# Patient Record
Sex: Female | Born: 1990
Health system: Southern US, Community
[De-identification: ages and names within clinical notes are randomized; demographics above are authoritative.]

## PROBLEM LIST (undated history)

## (undated) DIAGNOSIS — IMO0002 Reserved for concepts with insufficient information to code with codable children: Secondary | ICD-10-CM

## (undated) DIAGNOSIS — I10 Essential (primary) hypertension: Secondary | ICD-10-CM

## (undated) DIAGNOSIS — R569 Unspecified convulsions: Secondary | ICD-10-CM

## (undated) DIAGNOSIS — D689 Coagulation defect, unspecified: Secondary | ICD-10-CM

## (undated) DIAGNOSIS — M329 Systemic lupus erythematosus, unspecified: Secondary | ICD-10-CM

## (undated) DIAGNOSIS — M810 Age-related osteoporosis without current pathological fracture: Secondary | ICD-10-CM

## (undated) HISTORY — PX: NECK SURGERY: SHX720

## (undated) HISTORY — PX: TOOTH EXTRACTION: SUR596

## (undated) HISTORY — PX: PEG TUBE PLACEMENT: SUR1034

## (undated) HISTORY — PX: OTHER SURGICAL HISTORY: SHX169

---

## 2017-09-21 ENCOUNTER — Emergency Department (HOSPITAL_COMMUNITY): Payer: Medicare Other

## 2017-09-21 ENCOUNTER — Encounter (HOSPITAL_COMMUNITY): Payer: Self-pay | Admitting: Emergency Medicine

## 2017-09-21 ENCOUNTER — Emergency Department (HOSPITAL_COMMUNITY)
Admission: EM | Admit: 2017-09-21 | Discharge: 2017-09-21 | Disposition: A | Discharge status: Home / Self Care | Payer: Medicare Other | Attending: Emergency Medicine | Admitting: Emergency Medicine

## 2017-09-21 DIAGNOSIS — I10 Essential (primary) hypertension: Secondary | ICD-10-CM | POA: Insufficient documentation

## 2017-09-21 DIAGNOSIS — R079 Chest pain, unspecified: Secondary | ICD-10-CM | POA: Diagnosis present

## 2017-09-21 DIAGNOSIS — N3000 Acute cystitis without hematuria: Secondary | ICD-10-CM

## 2017-09-21 DIAGNOSIS — Z79899 Other long term (current) drug therapy: Secondary | ICD-10-CM | POA: Insufficient documentation

## 2017-09-21 DIAGNOSIS — M329 Systemic lupus erythematosus, unspecified: Secondary | ICD-10-CM | POA: Insufficient documentation

## 2017-09-21 DIAGNOSIS — M791 Myalgia, unspecified site: Secondary | ICD-10-CM | POA: Diagnosis not present

## 2017-09-21 HISTORY — DX: Reserved for concepts with insufficient information to code with codable children: IMO0002

## 2017-09-21 HISTORY — DX: Unspecified convulsions: R56.9

## 2017-09-21 HISTORY — DX: Essential (primary) hypertension: I10

## 2017-09-21 HISTORY — DX: Systemic lupus erythematosus, unspecified: M32.9

## 2017-09-21 HISTORY — DX: Coagulation defect, unspecified: D68.9

## 2017-09-21 HISTORY — DX: Age-related osteoporosis without current pathological fracture: M81.0

## 2017-09-21 LAB — COMPREHENSIVE METABOLIC PANEL
ALBUMIN: 3.1 g/dL — AB (ref 3.5–5.0)
ALT: 8 U/L — ABNORMAL LOW (ref 14–54)
AST: 15 U/L (ref 15–41)
Alkaline Phosphatase: 78 U/L (ref 38–126)
Anion gap: 7 (ref 5–15)
BILIRUBIN TOTAL: 0.5 mg/dL (ref 0.3–1.2)
BUN: 20 mg/dL (ref 6–20)
CO2: 21 mmol/L — ABNORMAL LOW (ref 22–32)
Calcium: 8.7 mg/dL — ABNORMAL LOW (ref 8.9–10.3)
Chloride: 111 mmol/L (ref 101–111)
Creatinine, Ser: 0.54 mg/dL (ref 0.44–1.00)
GFR calc Af Amer: >60 mL/min (ref >60)
GFR calc non Af Amer: >60 mL/min (ref >60)
GLUCOSE: 85 mg/dL (ref 65–99)
POTASSIUM: 3.2 mmol/L — AB (ref 3.5–5.1)
Sodium: 139 mmol/L (ref 135–145)
TOTAL PROTEIN: 10.7 g/dL — AB (ref 6.5–8.1)

## 2017-09-21 LAB — I-STAT BETA HCG BLOOD, ED (MC, WL, AP ONLY): HCG, QUANTITATIVE: 14.6 m[IU]/mL — AB (ref <5)

## 2017-09-21 LAB — URINALYSIS, ROUTINE W REFLEX MICROSCOPIC
BACTERIA UA: NONE SEEN
BILIRUBIN URINE: NEGATIVE
Glucose, UA: NEGATIVE mg/dL
Hgb urine dipstick: NEGATIVE
KETONES UR: NEGATIVE mg/dL
Nitrite: NEGATIVE
PROTEIN: 30 mg/dL — AB
Specific Gravity, Urine: 1.02 (ref 1.005–1.030)
pH: 6 (ref 5.0–8.0)

## 2017-09-21 LAB — CBC WITH DIFFERENTIAL/PLATELET
Abs Immature Granulocytes: 0 10*3/uL (ref 0.0–0.1)
BASOS ABS: 0 10*3/uL (ref 0.0–0.1)
Basophils Relative: 0 %
EOS PCT: 0 %
Eosinophils Absolute: 0 10*3/uL (ref 0.0–0.7)
HEMATOCRIT: 35 % — AB (ref 36.0–46.0)
HEMOGLOBIN: 10.7 g/dL — AB (ref 12.0–15.0)
Immature Granulocytes: 0 %
LYMPHS ABS: 2.9 10*3/uL (ref 0.7–4.0)
LYMPHS PCT: 35 %
MCH: 27.9 pg (ref 26.0–34.0)
MCHC: 30.6 g/dL (ref 30.0–36.0)
MCV: 91.1 fL (ref 78.0–100.0)
MONO ABS: 0.7 10*3/uL (ref 0.1–1.0)
MONOS PCT: 8 %
Neutro Abs: 4.6 10*3/uL (ref 1.7–7.7)
Neutrophils Relative %: 57 %
Platelets: 168 10*3/uL (ref 150–400)
RBC: 3.84 MIL/uL — ABNORMAL LOW (ref 3.87–5.11)
RDW: 13.8 % (ref 11.5–15.5)
WBC: 8.2 10*3/uL (ref 4.0–10.5)

## 2017-09-21 LAB — I-STAT TROPONIN, ED: Troponin i, poc: 0 ng/mL (ref 0.00–0.08)

## 2017-09-21 LAB — LIPASE, BLOOD: Lipase: 29 U/L (ref 11–51)

## 2017-09-21 LAB — PREGNANCY, URINE: Preg Test, Ur: NEGATIVE

## 2017-09-21 IMAGING — CR DG CHEST 2V
2 series · 2 of 2 positions shown · non-contrast
Comparison: None.

CLINICAL DATA: Lupus.  Chest pain.

EXAM:
CHEST - 2 VIEW

[chest pa]
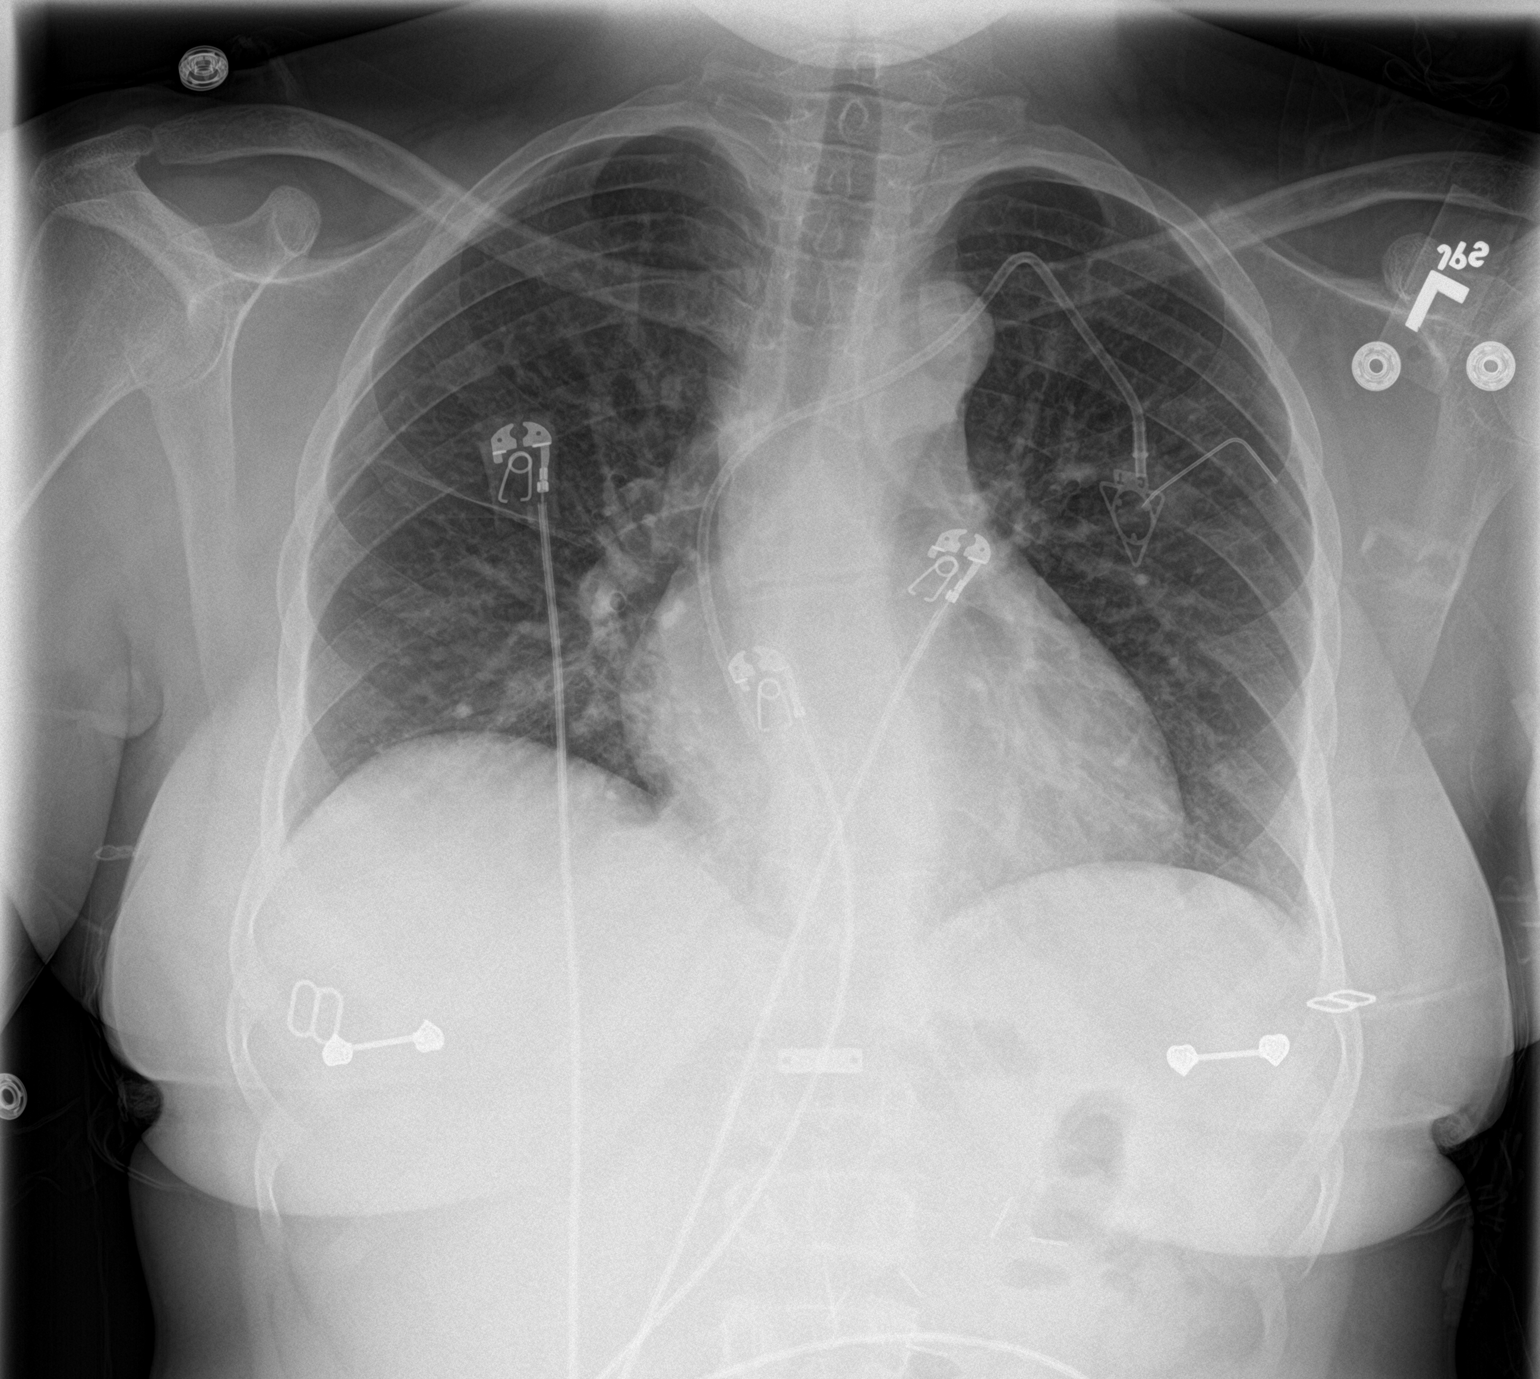

[chest lat]
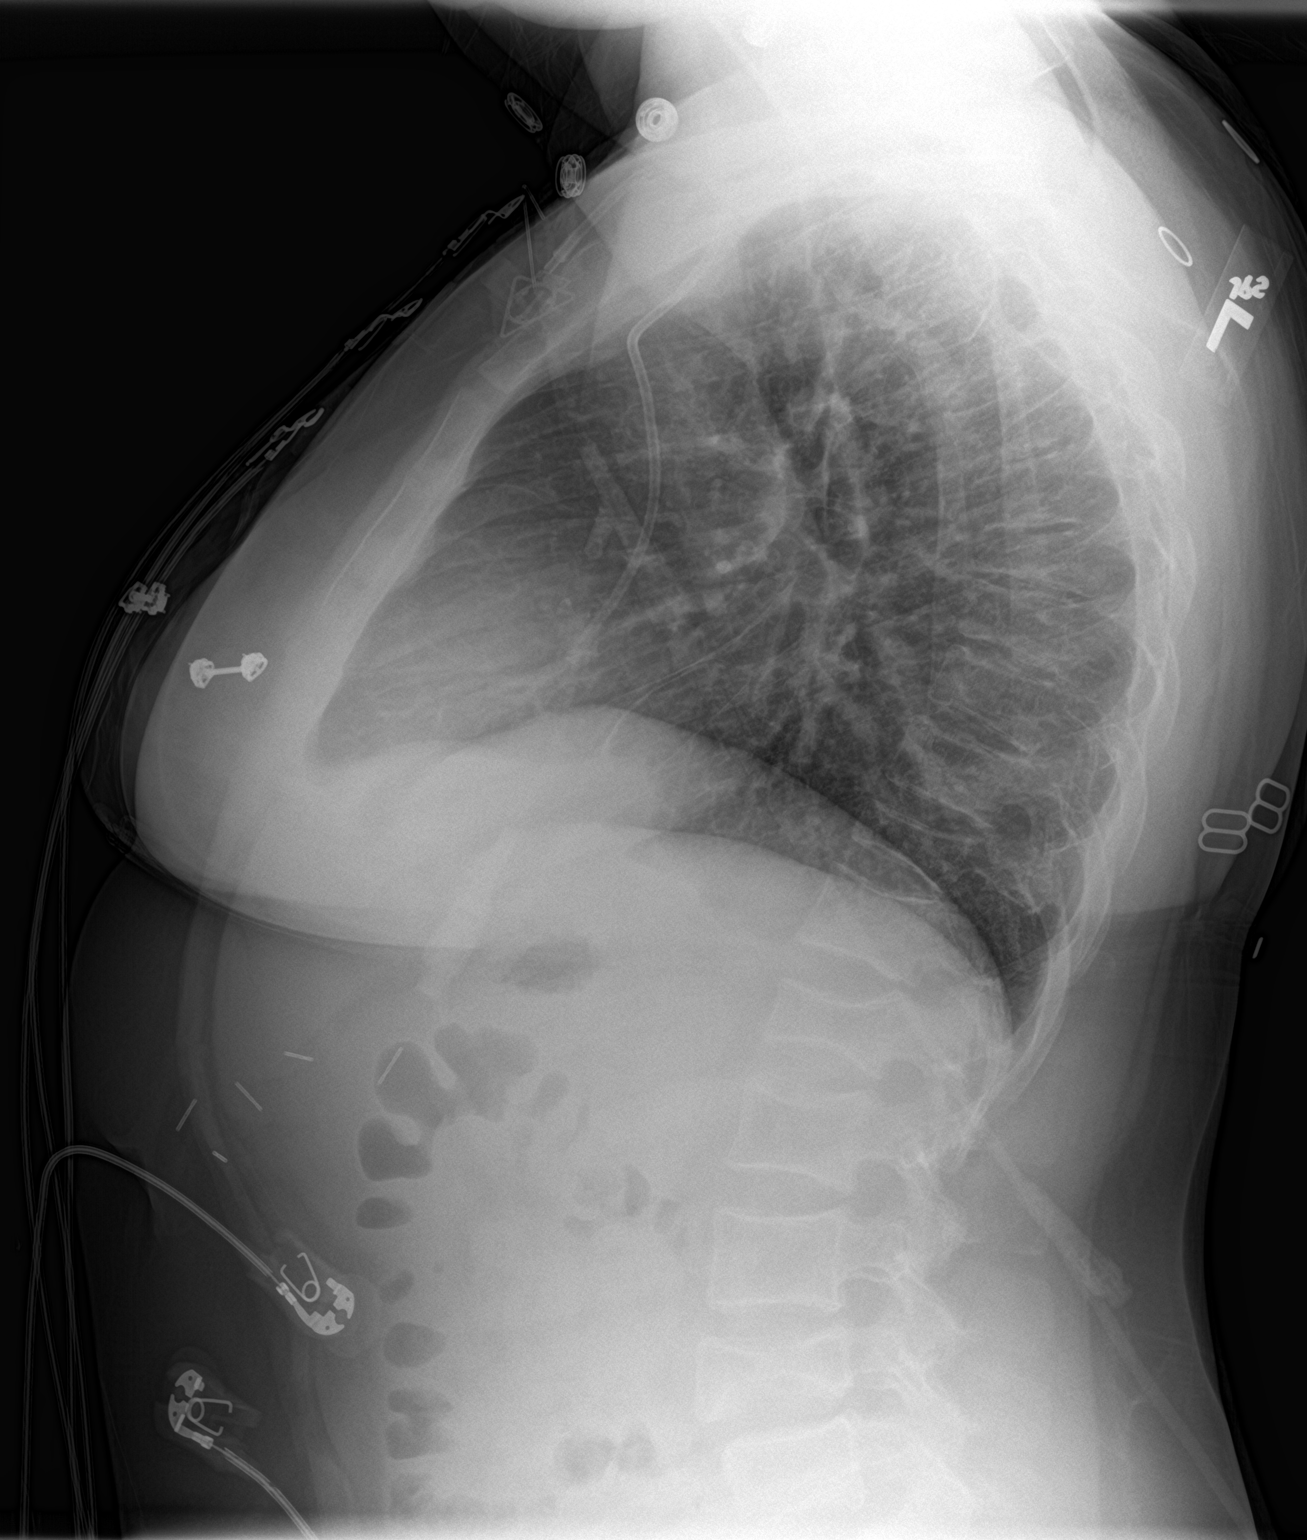

[2 of 2 positions shown; findings below may reference images not displayed]

FINDINGS: The heart size is exaggerated by low lung volumes. A left IJ
Port-A-Cath is accessed. Central compression fractures are present
throughout the thoracic and lumbar spine. No definite retropulsed
bone is present. These fractures appear remote [REDACTED] be related to
chronic steroid use.
IMPRESSION: 1. Low lung volumes.
2. No acute cardiopulmonary disease.
3. Left IJ Port-A-Cath.
4. Central compression fractures across multiple levels in the
thoracic and lumbar spine. No definite acute fracture. Please
correlate with localized pain or previous imaging.

## 2017-09-21 MED ORDER — METHYLPREDNISOLONE SODIUM SUCC 125 MG IJ SOLR
125.0000 mg | Freq: Once | INTRAMUSCULAR | Status: AC
  Administered 2017-09-21: 125 mg via INTRAVENOUS
  Filled 2017-09-21: qty 2

## 2017-09-21 MED ORDER — SODIUM CHLORIDE 0.9 % IV BOLUS
1000.0000 mL | Freq: Once | INTRAVENOUS | Status: AC
  Administered 2017-09-21: 1000 mL via INTRAVENOUS

## 2017-09-21 MED ORDER — CLONIDINE HCL 0.1 MG PO TABS
0.3000 mg | ORAL_TABLET | Freq: Once | ORAL | Status: AC
  Administered 2017-09-21: 0.3 mg via ORAL
  Filled 2017-09-21: qty 1

## 2017-09-21 MED ORDER — HEPARIN SOD (PORK) LOCK FLUSH 100 UNIT/ML IV SOLN
500.0000 [IU] | Freq: Once | INTRAVENOUS | Status: AC
  Administered 2017-09-21: 500 [IU]
  Filled 2017-09-21: qty 5

## 2017-09-21 MED ORDER — ONDANSETRON HCL 4 MG/2ML IJ SOLN
4.0000 mg | Freq: Once | INTRAMUSCULAR | Status: AC
  Administered 2017-09-21: 4 mg via INTRAVENOUS
  Filled 2017-09-21: qty 2

## 2017-09-21 MED ORDER — HYDROMORPHONE HCL 2 MG/ML IJ SOLN
0.5000 mg | Freq: Once | INTRAMUSCULAR | Status: AC
  Administered 2017-09-21: 0.5 mg via INTRAVENOUS
  Filled 2017-09-21: qty 1

## 2017-09-21 MED ORDER — CEPHALEXIN 500 MG PO CAPS: 500.0000 mg | ORAL_CAPSULE | Freq: Four times a day (QID) | ORAL | 0 refills | Status: DC

## 2017-09-21 MED ORDER — PROMETHAZINE HCL 25 MG/ML IJ SOLN
25.0000 mg | Freq: Once | INTRAMUSCULAR | Status: AC
  Administered 2017-09-21: 25 mg via INTRAVENOUS
  Filled 2017-09-21: qty 1

## 2017-09-21 MED ORDER — HYDROMORPHONE HCL 2 MG/ML IJ SOLN
1.0000 mg | Freq: Once | INTRAMUSCULAR | Status: AC
  Administered 2017-09-21: 1 mg via INTRAVENOUS
  Filled 2017-09-21: qty 1

## 2017-09-21 MED ORDER — SODIUM CHLORIDE 0.9 % IV SOLN
1.0000 g | Freq: Once | INTRAVENOUS | Status: AC
  Administered 2017-09-21: 1 g via INTRAVENOUS
  Filled 2017-09-21: qty 10

## 2017-09-21 MED ORDER — PREDNISONE 20 MG PO TABS: 40.0000 mg | ORAL_TABLET | Freq: Every day | ORAL | 0 refills | Status: AC

## 2017-09-21 NOTE — ED Notes (Signed)
Pt has not taken BP meds due to N/V.

## 2017-09-21 NOTE — Discharge Instructions (Addendum)
Take Prednisone as directed.  Take antibiotics as directed. Please take all of your antibiotics until finished.  Follow-up with your rheumatology doctor in the next 2 to 4 days for further evaluation.  Return to emergency department for any fever, chest pain, difficulty breathing, vomiting despite medications or any other worsening concerning symptoms.`

## 2017-09-21 NOTE — ED Triage Notes (Addendum)
Patient complains of nausea, neck pain, and loss of appetite x10 days Patient states she believes she is having a lupus flare-up. Alert, oriented, and ambulatory and in no apparent distress at this time.

## 2017-09-21 NOTE — ED Provider Notes (Signed)
Medical screening examination/treatment/procedure(s) were conducted as a shared visit with non-physician practitioner(s) and myself.  I personally evaluated the patient during the encounter.  EKG Interpretation  Date/Time:  Monday September 21 2017 12:25:25 EDT Ventricular Rate:  84 PR Interval:    QRS Duration: 112 QT Interval:  413 QTC Calculation: 489 R Axis:   29 Text Interpretation:  Sinus rhythm Borderline short PR interval Borderline intraventricular conduction delay Borderline prolonged QT interval No old tracing to compare Confirmed by Mancel BaleWentz, Elliott [phone]([id number]) on 09/22/2017 11:04:13 PM  Patient has prior history of lupus.  She reports recently she is having generalized pain.  Is also developed some nausea and vomiting.  She feels symptoms are consistent with prior episodes of lupus flareup.  She is compliant with Plaquenil and CellCept.  Patient is seen after initial phase of rehydration and pain control.  She is alert and appropriate no acute distress.  Heart is regular no gross rub murmur gallop.  Lungs clear without wheeze rhonchi or rale.  Movements are coordinated purposeful symmetric.  I agree with plan and management.   Arby BarrettePfeiffer, Sollie Vultaggio, MD 09/22/17 [id number]

## 2017-09-21 NOTE — ED Provider Notes (Signed)
[facility] Provider Note   CSN: [id number] Arrival date & time: 09/21/17  0827     History   Chief Complaint No chief complaint on file.   HPI Denise Browning is a 27 y.o. female possible history of lupus, osteoporosis, seizures who presents for evaluation of chest pain, nausea/vomiting consistent with previous episodes of lupus flare.  Patient reports that she has a history of lupus.  She follows a [place] Harro[place] [facility]obert [place] [place]  Patient reports [place] states she is not reestablish care yet.  Her last rheumatology appointment was several weeks ago.  Patient reports that for the last week and a half, she has had generalized pain, including intermittent chest pain that she describes as a sharp pressure.  She does report pain is worse with deep inspiration and worse with exertion.  Does not have any associated diaphoresis.  Patient reports that she has also had nausea and vomiting.  Emesis is nonbloody, nonbilious.  Patient reports tolerating small fluids but has not been able to tolerate much p.o.  Patient reports she has diffuse neck and back pain.  Patient reports that today's symptoms are consistent with her previous episodes of lupus flareup.  Patient reports she is on Plaquenil and CellCept which she states she has been compliant with.  Patient reports that she has had difficulty taking her medications over the last few days secondary to nausea/vomiting.  Patient denies any vision changes, fevers, abdominal pain, difficulty breathing, numbness/weakness of her extremities. She denies any OCP use, recent immobilization, prior history of DVT/PE, recent surgery, leg swelling, or long travel. Denies fevers, weight loss, numbness/weakness of upper and lower extremities, bowel/bladder incontinence, saddle anesthesia, history of back surgery, history of IVDA.   The  history is provided by the patient.    Past Medical History:  Diagnosis Date  . Clotting disorder (HCC)   . Hypertension   . Lupus (HCC)   . Osteoporosis   . Seizures (HCC)     There are no active problems to display for this patient.    The histories are not reviewed yet. Please review them in the "History" navigator section and refresh this SmartLink.   OB History   None      Home Medications    Prior to Admission medications   Medication Sig Start Date End Date Taking? Authorizing Provider  cetirizine (ZYRTEC) 10 MG tablet Take 10 mg by mouth as needed for allergies.   Yes [provider]  cloNIDine (CATAPRES) 0.3 MG tablet Take 0.3 mg by mouth 2 (two) times daily.   Yes [provider]  etonogestrel-ethinyl estradiol (NUVARING) 0.12-0.015 MG/24HR vaginal ring Place 1 each vaginally every 28 (twenty-eight) days. Insert vaginally and leave in place for 3 consecutive weeks, then remove for 1 week.   Yes [provider]  fondaparinux (ARIXTRA) 7.5 MG/0.6ML SOLN injection Inject 7.5 mg into the skin daily.   Yes [provider]  hydrocortisone cream 1 % Apply 1 application topically as needed for itching.   Yes [provider]  hydroxychloroquine (PLAQUENIL) 200 MG tablet Take 200 mg by mouth 2 (two) times daily.   Yes [provider]  ibuprofen (ADVIL,MOTRIN) 600 MG tablet Take 600 mg by mouth every 6 (six) hours as needed for moderate pain.   Yes [provider]  levETIRAcetam (KEPPRA) 100 MG/ML solution Take 1,500 mg by mouth 2 (two) times daily.  Yes [provider]  Multiple Vitamin (MULTIVITAMIN WITH MINERALS) TABS tablet Take 1 tablet by mouth daily.   Yes [provider]  mycophenolate (CELLCEPT) 200 MG/ML suspension Take 500 mg by mouth 2 (two) times daily.    Yes [provider]  oxyCODONE-acetaminophen (PERCOCET) 10-325 MG tablet Take 1 tablet by mouth every 6 (six) hours as  needed for pain.   Yes [provider]  oxyCODONE-acetaminophen (PERCOCET/ROXICET) 5-325 MG tablet Take 1 tablet by mouth every 6 (six) hours as needed for severe pain.   Yes [provider]  Probiotic Product (PROBIOTIC-10 PO) Take 1 tablet by mouth daily.   Yes [provider]  promethazine (PHENERGAN) 25 MG tablet Take 25 mg by mouth every 12 (twelve) hours as needed for nausea or vomiting.   Yes [provider]  tiZANidine (ZANAFLEX) 4 MG tablet Take 4 mg by mouth every 8 (eight) hours as needed for muscle spasms.   Yes [provider]  topiramate (TOPAMAX) 25 MG tablet Take 25 mg by mouth 2 (two) times daily.   Yes [provider]  zolpidem (AMBIEN) 10 MG tablet Take 10 mg by mouth at bedtime as needed for sleep.   Yes [provider]    Family History No family history on file.  Social History Social History   Tobacco Use  . Smoking status: Not on file  Substance Use Topics  . Alcohol use: Not on file  . Drug use: Not on file     Allergies   Belimumab; Morphine and related; Shellfish allergy; Sulfa antibiotics; and Toradol [ketorolac tromethamine]   Review of Systems Review of Systems  Constitutional: Positive for appetite change. Negative for chills and fever.  Eyes: Negative for visual disturbance.  Respiratory: Negative for cough and shortness of breath.   Cardiovascular: Positive for chest pain.  Gastrointestinal: Positive for nausea and vomiting. Negative for abdominal pain and diarrhea.  Genitourinary: Negative for dysuria and hematuria.  Musculoskeletal: Positive for back pain, myalgias and neck pain.  Skin: Negative for rash.  Neurological: Negative for dizziness, weakness, numbness and headaches.  All other systems reviewed and are negative.    Physical Exam Updated Vital Signs BP (!) 134/107   Pulse 80   Temp 98.1 F (36.7 C) (Oral)   Resp 18   LMP 08/20/2017   SpO2 100%   Physical Exam    Constitutional: She is oriented to person, place, and time. She appears well-developed and well-nourished.  HENT:  Head: Normocephalic and atraumatic.  Mouth/Throat: Oropharynx is clear and moist and mucous membranes are normal.  Eyes: Pupils are equal, round, and reactive to light. Conjunctivae, EOM and lids are normal.  Neck: Full passive range of motion without pain.  Full flexion/extension and lateral movement of neck fully intact. No bony midline tenderness. No deformities or crepitus.   Cardiovascular: Normal rate, regular rhythm, normal heart sounds and normal pulses. Exam reveals no gallop and no friction rub.  No murmur heard. Port noted to left anterior chest wall.  No surrounding erythema, warmth, drainage.  Pulmonary/Chest: Effort normal and breath sounds normal.  Lungs clear to auscultation bilaterally.  Symmetric chest rise.  No wheezing, rales, rhonchi.  Abdominal: Soft. Normal appearance. There is no tenderness. There is no rigidity and no guarding.  Musculoskeletal: Normal range of motion.  Neurological: She is alert and oriented to person, place, and time.  Skin: Skin is warm and dry. Capillary refill takes less than 2 seconds.  Psychiatric: She has a  normal mood and affect. Her speech is normal.  Nursing note and vitals reviewed.    ED Treatments / Results  Labs (all labs ordered are listed, but only abnormal results are displayed) Labs Reviewed  COMPREHENSIVE METABOLIC PANEL - Abnormal; Notable for the following components:      Result Value   Potassium 3.2 (*)    CO2 21 (*)    Calcium 8.7 (*)    Total Protein 10.7 (*)    Albumin 3.1 (*)    ALT 8 (*)    All other components within normal limits  CBC WITH DIFFERENTIAL/PLATELET - Abnormal; Notable for the following components:   RBC 3.84 (*)    Hemoglobin 10.7 (*)    HCT 35.0 (*)    All other components within normal limits  URINALYSIS, ROUTINE W REFLEX MICROSCOPIC - Abnormal; Notable for the following  components:   Protein, ur 30 (*)    Leukocytes, UA MODERATE (*)    All other components within normal limits  I-STAT BETA HCG BLOOD, ED ([facility], [facility], [facility] ONLY) - Abnormal; Notable for the following components:   I-stat hCG, quantitative 14.6 (*)    All other components within normal limits  LIPASE, BLOOD  PREGNANCY, URINE  I-STAT TROPONIN, ED    EKG None  Radiology Dg Chest 2 View  Result Date: 09/21/2017 CLINICAL DATA:  Lupus.  Chest pain. EXAM: CHEST - 2 VIEW COMPARISON:  None. FINDINGS: The heart size is exaggerated by low lung volumes. A left IJ Port-A-Cath is accessed. Central compression fractures are present throughout the thoracic and lumbar spine. No definite retropulsed bone is present. These fractures appear remote in may be related to chronic steroid use. IMPRESSION: 1. Low lung volumes. 2. No acute cardiopulmonary disease. 3. Left IJ Port-A-Cath. 4. Central compression fractures across multiple levels in the thoracic and lumbar spine. No definite acute fracture. Please correlate with localized pain or previous imaging. Electronically Signed   By: Marin Roberts M.D.   On: 09/21/2017 12:03    Procedures Procedures (including critical care time)  Medications Ordered in ED Medications  cloNIDine (CATAPRES) tablet 0.3 mg (has no administration in time range)  sodium chloride 0.9 % bolus 1,000 mL (0 mLs Intravenous Stopped 09/21/17 1335)  ondansetron (ZOFRAN) injection 4 mg (4 mg Intravenous Given 09/21/17 1137)  HYDROmorphone (DILAUDID) injection 0.5 mg (0.5 mg Intravenous Given 09/21/17 1137)  methylPREDNISolone sodium succinate (SOLU-MEDROL) 125 mg/2 mL injection 125 mg (125 mg Intravenous Given 09/21/17 1137)  HYDROmorphone (DILAUDID) injection 1 mg (1 mg Intravenous Given 09/21/17 1335)  ondansetron (ZOFRAN) injection 4 mg (4 mg Intravenous Given 09/21/17 1335)  sodium chloride 0.9 % bolus 1,000 mL (0 mLs Intravenous Stopped 09/21/17 1532)  cefTRIAXone (ROCEPHIN) 1 g in sodium  chloride 0.9 % 100 mL IVPB (1 g Intravenous New Bag/Given 09/21/17 1533)  promethazine (PHENERGAN) injection 25 mg (25 mg Intravenous Given 09/21/17 1546)  HYDROmorphone (DILAUDID) injection 0.5 mg (0.5 mg Intravenous Given 09/21/17 1546)     Initial Impression / Assessment and Plan / ED Course  I have reviewed the triage vital signs and the nursing notes.  Pertinent labs & imaging results that were available during my care of the patient were reviewed by me and considered in my medical decision making (see chart for details).       27 year old female with possible history of lupus who presents for generalized myalgias, nausea/vomiting consistent with lupus flare.  Follows rheumatology in [place].  Is relocating to [facility] and has  not established with rheumatology yet.  She still sees her rheumatologist in [place].  No recent fevers. Patient is afebrile, non-toxic appearing, sitting comfortably on examination table. Vital signs reviewed.  Patient is slightly tachycardic.  Plan for basic labs, fluids, pain medications.  UA shows moderate leukocytes, pyuria with few squamous cells seen.  Troponin negative.  Urine pregnancy negative.  I-STAT beta slightly elevated.  Lipase unremarkable.  CBC shows no significant leukocytosis.  Hemoglobin is slightly low at 10.7.  CMP shows slight hypokalemia at 3.2.  Bicarb is 21.  Otherwise unremarkable.  Discussed results with patient.  She does not know if she has been diagnosed with anemia before.  There is no previous CBCs for comparison.  Chest x-ray negative for any acute infectious etiology.  There is mention of some compression fractures but do not appear to be acute.  I discussed results with patient.  She reports she has known about the compression fractures and states that she has had them from her osteoporosis.  Re-evaluation. Patient denies any blood in stool.  She reports feeling slightly better after fluids, Zofran and  analgesics.  But still having some slight pain.  Will give additional analgesics.  Reevaluation.  Patient reports she feels better.  Will give 1 more dose of pain medication and antiemetics for symptomatic relief.  I discussed admission options with patient.  Offered to admit her for further observation of lupus flare with continuous fluids, pain medications.  Patient does not wish to be admitted at this time.  Plan to send home with prednisone.  Patient instructed to follow-up with her rheumatologist.  Reevaluation after analgesics and antiemetics.  Patient reports feeling better.  Again offered patient admission but states that she feels better after analgesics and does not wish to be admitted at this time.  Plan to p.o. challenge patient in the department.  Patient able to tolerate crackers and water here in the ED without any difficulty.  Patient requesting her blood pressure medications and she was unable to tolerate it today.  Will give 1 dose here in the ED.  I discussed with patient regarding treatment options.  Again patient declines admission at this time.  Patient stable for discharge at this time.  She has been able to walk without any difficulty.  He has tolerated p.o. without any difficulty. Patient stable for discharge. Patient had ample opportunity for questions and discussion. All patient's questions were answered with full understanding. Strict return precautions discussed. Patient expresses understanding and agreement to plan.    Final Clinical Impressions(s) / ED Diagnoses   Final diagnoses:  Myalgia  Lupus (HCC)  Acute cystitis without hematuria    ED Discharge Orders    None       Rosana Hoes 09/22/17 1708    Arby Barrette, MD 09/22/17 2339

## 2017-09-21 NOTE — ED Notes (Signed)
Patient asks that blood be drawn from her powerport.

## 2017-10-19 ENCOUNTER — Emergency Department (HOSPITAL_BASED_OUTPATIENT_CLINIC_OR_DEPARTMENT_OTHER)
Admission: EM | Admit: 2017-10-19 | Discharge: 2017-10-19 | Disposition: A | Discharge status: Home / Self Care | Payer: Medicare Other | Attending: Emergency Medicine | Admitting: Emergency Medicine

## 2017-10-19 ENCOUNTER — Encounter (HOSPITAL_BASED_OUTPATIENT_CLINIC_OR_DEPARTMENT_OTHER): Payer: Self-pay | Admitting: Emergency Medicine

## 2017-10-19 ENCOUNTER — Other Ambulatory Visit: Payer: Self-pay

## 2017-10-19 DIAGNOSIS — M3210 Systemic lupus erythematosus, organ or system involvement unspecified: Secondary | ICD-10-CM | POA: Insufficient documentation

## 2017-10-19 DIAGNOSIS — Z79899 Other long term (current) drug therapy: Secondary | ICD-10-CM | POA: Insufficient documentation

## 2017-10-19 DIAGNOSIS — I10 Essential (primary) hypertension: Secondary | ICD-10-CM | POA: Insufficient documentation

## 2017-10-19 DIAGNOSIS — R112 Nausea with vomiting, unspecified: Secondary | ICD-10-CM | POA: Diagnosis not present

## 2017-10-19 DIAGNOSIS — M7918 Myalgia, other site: Secondary | ICD-10-CM | POA: Diagnosis present

## 2017-10-19 LAB — URINALYSIS, MICROSCOPIC (REFLEX)

## 2017-10-19 LAB — CBC WITH DIFFERENTIAL/PLATELET
BASOS ABS: 0 10*3/uL (ref 0.0–0.1)
Basophils Relative: 0 %
EOS ABS: 0.1 10*3/uL (ref 0.0–0.7)
Eosinophils Relative: 1 %
HCT: 33.6 % — ABNORMAL LOW (ref 36.0–46.0)
Hemoglobin: 10.6 g/dL — ABNORMAL LOW (ref 12.0–15.0)
LYMPHS ABS: 7 10*3/uL — AB (ref 0.7–4.0)
Lymphocytes Relative: 58 %
MCH: 28.8 pg (ref 26.0–34.0)
MCHC: 31.5 g/dL (ref 30.0–36.0)
MCV: 91.3 fL (ref 78.0–100.0)
MONO ABS: 1.2 10*3/uL — AB (ref 0.1–1.0)
Monocytes Relative: 10 %
NEUTROS PCT: 31 %
Neutro Abs: 3.8 10*3/uL (ref 1.7–7.7)
PLATELETS: 116 10*3/uL — AB (ref 150–400)
RBC: 3.68 MIL/uL — AB (ref 3.87–5.11)
RDW: 14.4 % (ref 11.5–15.5)
WBC: 12.1 10*3/uL — ABNORMAL HIGH (ref 4.0–10.5)

## 2017-10-19 LAB — COMPREHENSIVE METABOLIC PANEL
ALBUMIN: 3.2 g/dL — AB (ref 3.5–5.0)
ALT: 7 U/L (ref 0–44)
ANION GAP: 7 (ref 5–15)
AST: 17 U/L (ref 15–41)
Alkaline Phosphatase: 78 U/L (ref 38–126)
BUN: 10 mg/dL (ref 6–20)
CALCIUM: 8.1 mg/dL — AB (ref 8.9–10.3)
CO2: 19 mmol/L — ABNORMAL LOW (ref 22–32)
Chloride: 109 mmol/L (ref 98–111)
Creatinine, Ser: 0.49 mg/dL (ref 0.44–1.00)
GFR calc Af Amer: >60 mL/min (ref >60)
GFR calc non Af Amer: >60 mL/min (ref >60)
Glucose, Bld: 99 mg/dL (ref 70–99)
Potassium: 3.7 mmol/L (ref 3.5–5.1)
SODIUM: 135 mmol/L (ref 135–145)
TOTAL PROTEIN: 10 g/dL — AB (ref 6.5–8.1)
Total Bilirubin: 0.1 mg/dL — ABNORMAL LOW (ref 0.3–1.2)

## 2017-10-19 LAB — URINALYSIS, ROUTINE W REFLEX MICROSCOPIC
BILIRUBIN URINE: NEGATIVE
Glucose, UA: NEGATIVE mg/dL
Hgb urine dipstick: NEGATIVE
KETONES UR: NEGATIVE mg/dL
NITRITE: NEGATIVE
PH: 6.5 (ref 5.0–8.0)
Protein, ur: 30 mg/dL — AB
Specific Gravity, Urine: 1.01 (ref 1.005–1.030)

## 2017-10-19 LAB — LIPASE, BLOOD: Lipase: 25 U/L (ref 11–51)

## 2017-10-19 LAB — PREGNANCY, URINE: Preg Test, Ur: NEGATIVE

## 2017-10-19 MED ORDER — ONDANSETRON HCL 4 MG/2ML IJ SOLN
4.0000 mg | Freq: Once | INTRAMUSCULAR | Status: AC
  Administered 2017-10-19: 4 mg via INTRAVENOUS
  Filled 2017-10-19: qty 2

## 2017-10-19 MED ORDER — SODIUM CHLORIDE 0.9 % IV BOLUS
1000.0000 mL | Freq: Once | INTRAVENOUS | Status: AC
  Administered 2017-10-19: 1000 mL via INTRAVENOUS

## 2017-10-19 MED ORDER — HYDROMORPHONE HCL 1 MG/ML IJ SOLN
1.0000 mg | Freq: Once | INTRAMUSCULAR | Status: AC
  Administered 2017-10-19: 1 mg via INTRAVENOUS
  Filled 2017-10-19: qty 1

## 2017-10-19 MED ORDER — FENTANYL CITRATE (PF) 100 MCG/2ML IJ SOLN
50.0000 ug | Freq: Once | INTRAMUSCULAR | Status: AC
  Administered 2017-10-19: 50 ug via INTRAVENOUS
  Filled 2017-10-19 (×2): qty 2

## 2017-10-19 MED ORDER — ONDANSETRON 4 MG PO TBDP: ORAL_TABLET | ORAL | 0 refills | Status: DC

## 2017-10-19 MED ORDER — HEPARIN SOD (PORK) LOCK FLUSH 100 UNIT/ML IV SOLN
INTRAVENOUS | Status: AC
  Administered 2017-10-19: 500 [IU]
  Filled 2017-10-19: qty 5

## 2017-10-19 MED FILL — ONDANSETRON ODT 4 MG TABLET: 4 | 3 days supply | Qty: 10 | Fill #0

## 2017-10-19 NOTE — ED Provider Notes (Addendum)
[facility] EMERGENCY DEPARTMENT Provider Note   CSN: [id number] Arrival date & time: 10/19/17  4098     History   Chief Complaint Chief Complaint  Patient presents with  . Lupus flare    HPI [place] Kairi Harshbarger is a 27 y.o. female.  HPI  This is a 27 year old female with a history of lupus, hypertension, osteoporosis, seizures who presents with pain, nausea and vomiting.  Patient reports history of lupus for which she takes CellCept and Plaquenil.  She is establishing care at [facility] and has her first appointment July 15.  Since Thursday she reports generalized body aches mostly in the back, right shoulder, and legs as well as nausea and vomiting.  Currently she rates her pain at 8 out of 10.  It is mostly achy.  This is classic for her lupus flares.  She states that she tried to do a clear liquid diet but continued to have more vomiting.  She reports some epigastric pain which is worse with vomiting.  She denies any recent fevers, chest pain, shortness of breath.  Last menstrual period was June 18.  She does not believe she would be pregnant.  She denies any urinary symptoms.  Patient frequently requires fluids and pain and nausea medication.  She often times will be discharged with a prednisone taper.  Past Medical History:  Diagnosis Date  . Clotting disorder (HCC)   . Hypertension   . Lupus (HCC)   . Osteoporosis   . Seizures (HCC)     There are no active problems to display for this patient.   Past Surgical History:  Procedure Laterality Date  . NECK SURGERY    . PEG TUBE PLACEMENT    . power port placement    . TOOTH EXTRACTION       OB History   None      Home Medications    Prior to Admission medications   Medication Sig Start Date End Date Taking? Authorizing Provider  cephALEXin (KEFLEX) 500 MG capsule Take 1 capsule (500 mg total) by mouth 4 (four) times daily. 09/21/17   Maxwell Caul, PA-C  cetirizine (ZYRTEC) 10 MG tablet Take 10 mg  by mouth as needed for allergies.    [provider]  cloNIDine (CATAPRES) 0.3 MG tablet Take 0.3 mg by mouth 2 (two) times daily.    [provider]  etonogestrel-ethinyl estradiol (NUVARING) 0.12-0.015 MG/24HR vaginal ring Place 1 each vaginally every 28 (twenty-eight) days. Insert vaginally and leave in place for 3 consecutive weeks, then remove for 1 week.    [provider]  fondaparinux (ARIXTRA) 7.5 MG/0.6ML SOLN injection Inject 7.5 mg into the skin daily.    [provider]  hydrocortisone cream 1 % Apply 1 application topically as needed for itching.    [provider]  hydroxychloroquine (PLAQUENIL) 200 MG tablet Take 200 mg by mouth 2 (two) times daily.    [provider]  ibuprofen (ADVIL,MOTRIN) 600 MG tablet Take 600 mg by mouth every 6 (six) hours as needed for moderate pain.    [provider]  levETIRAcetam (KEPPRA) 100 MG/ML solution Take 1,500 mg by mouth 2 (two) times daily.     [provider]  Multiple Vitamin (MULTIVITAMIN WITH MINERALS) TABS tablet Take 1 tablet by mouth daily.    [provider]  mycophenolate (CELLCEPT) 200 MG/ML suspension Take 500 mg by mouth 2 (two) times daily.     [provider]  oxyCODONE-acetaminophen (PERCOCET) [phone]  MG tablet Take 1 tablet by mouth every 6 (six) hours as needed for pain.    [provider]  oxyCODONE-acetaminophen (PERCOCET/ROXICET) 5-325 MG tablet Take 1 tablet by mouth every 6 (six) hours as needed for severe pain.    [provider]  Probiotic Product (PROBIOTIC-10 PO) Take 1 tablet by mouth daily.    [provider]  promethazine (PHENERGAN) 25 MG tablet Take 25 mg by mouth every 12 (twelve) hours as needed for nausea or vomiting.    [provider]  tiZANidine (ZANAFLEX) 4 MG tablet Take 4 mg by mouth every 8 (eight) hours as needed for muscle spasms.    [provider]  topiramate (TOPAMAX)  25 MG tablet Take 25 mg by mouth 2 (two) times daily.    [provider]  zolpidem (AMBIEN) 10 MG tablet Take 10 mg by mouth at bedtime as needed for sleep.    [provider]    Family History No family history on file.  Social History Social History   Tobacco Use  . Smoking status: Never Smoker  . Smokeless tobacco: Never Used  Substance Use Topics  . Alcohol use: Yes    Comment: occasional  . Drug use: Never     Allergies   Belimumab; Morphine and related; Shellfish allergy; Sulfa antibiotics; and Toradol [ketorolac tromethamine]   Review of Systems Review of Systems  Constitutional: Negative for fever.  Respiratory: Negative for shortness of breath.   Cardiovascular: Negative for chest pain.  Gastrointestinal: Positive for abdominal pain, nausea and vomiting. Negative for constipation and diarrhea.  Genitourinary: Negative for dysuria.  Musculoskeletal: Positive for back pain and myalgias.  Neurological: Positive for headaches. Negative for dizziness and weakness.  All other systems reviewed and are negative.    Physical Exam Updated Vital Signs BP (!) 149/111 (BP Location: Left Arm)   Pulse (!) 120   Temp 98.8 F (37.1 C) (Oral)   Resp 18   LMP 10/06/2017   SpO2 100%   Physical Exam  Constitutional: She is oriented to person, place, and time. She appears well-developed and well-nourished.  Chronically ill-appearing, nontoxic  HENT:  Head: Normocephalic and atraumatic.  Eyes: Pupils are equal, round, and reactive to light.  Neck: Neck supple.  Cardiovascular: Normal rate and normal heart sounds.  No murmur heard. Tachycardia  Pulmonary/Chest: Effort normal and breath sounds normal. No respiratory distress. She has no wheezes.  Port left upper chest  Abdominal: Soft. Bowel sounds are normal. There is no tenderness. There is no rebound and no guarding.  Musculoskeletal: She exhibits no edema.  Neurological: She is alert and oriented to  person, place, and time.  Skin: Skin is warm and dry.  Psychiatric: She has a normal mood and affect.  Nursing note and vitals reviewed.    ED Treatments / Results  Labs (all labs ordered are listed, but only abnormal results are displayed) Labs Reviewed  CBC WITH DIFFERENTIAL/PLATELET  COMPREHENSIVE METABOLIC PANEL  LIPASE, BLOOD  URINALYSIS, ROUTINE W REFLEX MICROSCOPIC  POC URINE PREG, ED    EKG EKG Interpretation  Date/Time:  Monday October 19 2017 07:02:04 EDT Ventricular Rate:  117 PR Interval:    QRS Duration: 93 QT Interval:  347 QTC Calculation: 485 R Axis:   52 Text Interpretation:  Sinus tachycardia Borderline repolarization abnormality Borderline prolonged QT interval Baseline wander in lead(s) V2 Confirmed by Ross MarcusHorton, Courtney ([id number]) on 10/19/2017 7:04:53 AM   Radiology No results found.  Procedures Procedures (including critical care  time)  Medications Ordered in ED Medications  ondansetron (ZOFRAN) injection 4 mg (has no administration in time range)  fentaNYL (SUBLIMAZE) injection 50 mcg (has no administration in time range)  sodium chloride 0.9 % bolus 1,000 mL (has no administration in time range)     Initial Impression / Assessment and Plan / ED Course  I have reviewed the triage vital signs and the nursing notes.  Pertinent labs & imaging results that were available during my care of the patient were reviewed by me and considered in my medical decision making (see chart for details).     Patient presents with symptoms consistent with prior lupus flares.  She is overall nontoxic-appearing.  Vital signs are notable for heart rate of 120.  Basic lab work obtained to evaluate for metabolic derangements.  Will obtain EKG as well.  Patient given fluids for her tachycardia and likely dehydration given history.  Additionally, she was given pain and nausea medication.  All work-up is pending and will be signed out to oncoming provider pending  reassessment.  Final Clinical Impressions(s) / ED Diagnoses   Final diagnoses:  None    ED Discharge Orders    None       Horton, Mayer Masker, MD 10/19/17 1610    Shon Baton, MD 10/19/17 [phone]

## 2017-10-19 NOTE — ED Provider Notes (Signed)
  Physical Exam  BP (!) 137/91   Pulse (!) 104   Temp 98.8 F (37.1 C) (Oral)   Resp 16   LMP 10/06/2017   SpO2 100%   Physical Exam  ED Course/Procedures     Procedures  MDM  Patient care assumed at 7 am. Patient has hx of lupus on cellcept and plaquenil and here with vomiting, body aches. She states that this is typical of her lupus flare. Sign out pending labs, pain control, PO trial.   9:56 AM Labs unremarkable. Given zofran, fentanyl, dilaudid and pain improved. Tolerated PO in the ED. HR down to 104 in the ED from 120. Will refill her zofran. Stable for discharge.    Charlynne PanderYao, Jaquaveon Bilal Hsienta, MD 10/19/17 [phone]

## 2017-10-19 NOTE — ED Notes (Signed)
EDP aware that no pain med given.  He stated that we will wait for the lab results and he will go speak with pt and update the plan of care.  No pain med given at this time.  Pt in NAD.  No nonverbal signs of pain.

## 2017-10-19 NOTE — ED Triage Notes (Signed)
Hx lupus, pt is concerned she may be having a flare. N/V pain in legs, abd and back. Also reports insomnia. NAD

## 2017-10-19 NOTE — Discharge Instructions (Signed)
Take zofran for nausea.   Continue your current meds for lupus.   [facility]   Return to ER if you have worse abdominal pain, vomiting, fever, dehydration

## 2017-10-19 NOTE — ED Notes (Signed)
ED Provider at bedside, Dr. Silverio LayYao.

## 2017-10-20 LAB — PATHOLOGIST SMEAR REVIEW: PATH REVIEW: REACTIVE

## 2017-11-21 ENCOUNTER — Other Ambulatory Visit: Payer: Self-pay

## 2017-11-21 ENCOUNTER — Encounter (HOSPITAL_COMMUNITY): Payer: Self-pay | Admitting: Emergency Medicine

## 2017-11-21 ENCOUNTER — Emergency Department (HOSPITAL_COMMUNITY)
Admission: EM | Admit: 2017-11-21 | Discharge: 2017-11-21 | Disposition: A | Discharge status: Home / Self Care | Payer: Medicare Other | Attending: Emergency Medicine | Admitting: Emergency Medicine

## 2017-11-21 DIAGNOSIS — D649 Anemia, unspecified: Secondary | ICD-10-CM | POA: Diagnosis not present

## 2017-11-21 DIAGNOSIS — M329 Systemic lupus erythematosus, unspecified: Secondary | ICD-10-CM | POA: Diagnosis present

## 2017-11-21 DIAGNOSIS — I10 Essential (primary) hypertension: Secondary | ICD-10-CM | POA: Diagnosis not present

## 2017-11-21 DIAGNOSIS — Z79899 Other long term (current) drug therapy: Secondary | ICD-10-CM | POA: Insufficient documentation

## 2017-11-21 LAB — COMPREHENSIVE METABOLIC PANEL
ALT: 6 U/L (ref 0–44)
ANION GAP: 7 (ref 5–15)
AST: 14 U/L — ABNORMAL LOW (ref 15–41)
Albumin: 2.8 g/dL — ABNORMAL LOW (ref 3.5–5.0)
Alkaline Phosphatase: 58 U/L (ref 38–126)
BILIRUBIN TOTAL: 0.5 mg/dL (ref 0.3–1.2)
BUN: 10 mg/dL (ref 6–20)
CHLORIDE: 112 mmol/L — AB (ref 98–111)
CO2: 18 mmol/L — ABNORMAL LOW (ref 22–32)
Calcium: 8.1 mg/dL — ABNORMAL LOW (ref 8.9–10.3)
Creatinine, Ser: 0.71 mg/dL (ref 0.44–1.00)
Glucose, Bld: 81 mg/dL (ref 70–99)
Potassium: 3.6 mmol/L (ref 3.5–5.1)
Sodium: 137 mmol/L (ref 135–145)
TOTAL PROTEIN: 9.1 g/dL — AB (ref 6.5–8.1)

## 2017-11-21 LAB — CBC
HCT: 32.2 % — ABNORMAL LOW (ref 36.0–46.0)
Hemoglobin: 9.5 g/dL — ABNORMAL LOW (ref 12.0–15.0)
MCH: 28 pg (ref 26.0–34.0)
MCHC: 29.5 g/dL — ABNORMAL LOW (ref 30.0–36.0)
MCV: 95 fL (ref 78.0–100.0)
Platelets: 109 10*3/uL — ABNORMAL LOW (ref 150–400)
RBC: 3.39 MIL/uL — AB (ref 3.87–5.11)
RDW: 14.8 % (ref 11.5–15.5)
WBC: 9 10*3/uL (ref 4.0–10.5)

## 2017-11-21 LAB — I-STAT BETA HCG BLOOD, ED (MC, WL, AP ONLY): HCG, QUANTITATIVE: 8.7 m[IU]/mL — AB (ref <5)

## 2017-11-21 LAB — URINALYSIS, ROUTINE W REFLEX MICROSCOPIC
BACTERIA UA: NONE SEEN
BILIRUBIN URINE: NEGATIVE
Glucose, UA: NEGATIVE mg/dL
Ketones, ur: NEGATIVE mg/dL
Leukocytes, UA: NEGATIVE
NITRITE: NEGATIVE
Protein, ur: NEGATIVE mg/dL
Specific Gravity, Urine: 1.031 — ABNORMAL HIGH (ref 1.005–1.030)
pH: 6 (ref 5.0–8.0)

## 2017-11-21 LAB — LIPASE, BLOOD: LIPASE: 24 U/L (ref 11–51)

## 2017-11-21 MED ORDER — SODIUM CHLORIDE 0.9 % IV BOLUS
1000.0000 mL | Freq: Once | INTRAVENOUS | Status: AC
  Administered 2017-11-21: 1000 mL via INTRAVENOUS

## 2017-11-21 MED ORDER — SODIUM CHLORIDE 0.9% FLUSH: 10.0000 mL | INTRAVENOUS | Status: DC | PRN

## 2017-11-21 MED ORDER — HEPARIN SOD (PORK) LOCK FLUSH 100 UNIT/ML IV SOLN
500.0000 [IU] | Freq: Once | INTRAVENOUS | Status: AC
  Administered 2017-11-21: 500 [IU]
  Filled 2017-11-21: qty 5

## 2017-11-21 MED ORDER — HYDROMORPHONE HCL 1 MG/ML IJ SOLN
0.5000 mg | Freq: Once | INTRAMUSCULAR | Status: AC
  Administered 2017-11-21: 0.5 mg via INTRAVENOUS
  Filled 2017-11-21: qty 1

## 2017-11-21 MED ORDER — FENTANYL CITRATE (PF) 100 MCG/2ML IJ SOLN
50.0000 ug | Freq: Once | INTRAMUSCULAR | Status: AC
  Administered 2017-11-21: 50 ug via INTRAVENOUS
  Filled 2017-11-21: qty 2

## 2017-11-21 MED ORDER — DIPHENHYDRAMINE HCL 50 MG/ML IJ SOLN
25.0000 mg | Freq: Once | INTRAMUSCULAR | Status: AC
  Administered 2017-11-21: 25 mg via INTRAVENOUS
  Filled 2017-11-21: qty 1

## 2017-11-21 MED ORDER — SODIUM CHLORIDE 0.9% FLUSH: 10.0000 mL | Freq: Two times a day (BID) | INTRAVENOUS | Status: DC

## 2017-11-21 MED ORDER — ONDANSETRON HCL 4 MG/2ML IJ SOLN
4.0000 mg | Freq: Once | INTRAMUSCULAR | Status: AC
  Administered 2017-11-21: 4 mg via INTRAVENOUS
  Filled 2017-11-21: qty 2

## 2017-11-21 NOTE — ED Notes (Signed)
Pt reports has port in place and would like to have blood work taken from that. Refusing blood draw in triage.

## 2017-11-21 NOTE — Discharge Instructions (Signed)
Your hemoglobin is lower today. Please have recheck of your hgb with your doctor this week. Please have recheck of pregnancy test which was slightly elevated

## 2017-11-21 NOTE — ED Notes (Signed)
Pt requesting bendaryl for itching on arms. Also has sores in mouth, she is using magic mouthwash at home.

## 2017-11-21 NOTE — ED Notes (Signed)
IV team at bedside 

## 2017-11-21 NOTE — ED Triage Notes (Signed)
Pt to ER for evaluation of generalized joint pain, nausea, vomiting, diarrhea, and rash. Pt has lupus, reports unable to keep medications down. Pt in NAD at this time. A/o x4.

## 2017-11-21 NOTE — ED Provider Notes (Signed)
[facility] EMERGENCY DEPARTMENT Provider Note   CSN: [id number] Arrival date & time: 11/21/17  0744     History   Chief Complaint Chief Complaint  Patient presents with  . Lupus    HPI Denise Browning is a 27 y.o. female.  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  She describes this as joint pain in her hips, back, and hands.  She states that "she hurts all over.  She states that she has had ongoing pain from lupus for many years.  She is currently on geiple medications for this.  She states she has been off of steroids for several years.  She denies any fever, chills, productive cough, or abdominal pain.  She is having nausea and vomiting.  Has had some diarrhea.  She denies any blood or black stool.  She states she has some rash on her forearms consistent with her lupus.  Patient has port in left chest which she states she has had for 4 years.  Patient states that she normally gets narcotic pain medicine as she is allergic to Toradol.  Past Medical History:  Diagnosis Date  . Clotting disorder (HCC)   . Hypertension   . Lupus (HCC)   . Osteoporosis   . Seizures (HCC)     There are no active problems to display for this patient.   Past Surgical History:  Procedure Laterality Date  . NECK SURGERY    . PEG TUBE PLACEMENT    . power port placement    . TOOTH EXTRACTION       OB History   None      Home Medications    Prior to Admission medications   Medication Sig Start Date End Date Taking? Authorizing Provider  cephALEXin (KEFLEX) 500 MG capsule Take 1 capsule (500 mg total) by mouth 4 (four) times daily. 09/21/17   Maxwell Caul, PA-C  cetirizine (ZYRTEC) 10 MG tablet Take 10 mg by mouth as needed for allergies.    [provider]  cloNIDine (CATAPRES) 0.3 MG tablet Take 0.3 mg by  mouth 2 (two) times daily.    [provider]  etonogestrel-ethinyl estradiol (NUVARING) 0.12-0.015 MG/24HR vaginal ring Place 1 each vaginally every 28 (twenty-eight) days. Insert vaginally and leave in place for 3 consecutive weeks, then remove for 1 week.    [provider]  fondaparinux (ARIXTRA) 7.5 MG/0.6ML SOLN injection Inject 7.5 mg into the skin daily.    [provider]  hydrocortisone cream 1 % Apply 1 application topically as needed for itching.    [provider]  hydroxychloroquine (PLAQUENIL) 200 MG tablet Take 200 mg by mouth 2 (two) times daily.    [provider]  ibuprofen (ADVIL,MOTRIN) 600 MG tablet Take 600 mg by mouth every 6 (six) hours as needed for moderate pain.    [provider]  levETIRAcetam (KEPPRA) 100 MG/ML solution Take 1,500 mg by mouth 2 (two) times daily.     [provider]  Multiple Vitamin (MULTIVITAMIN WITH MINERALS) TABS tablet Take 1 tablet by mouth daily.    [provider]  mycophenolate (CELLCEPT) 200 MG/ML suspension Take 500 mg by mouth 2 (two) times daily.     [provider]  ondansetron (ZOFRAN ODT) 4 MG disintegrating tablet 4mg  ODT q4 hours prn nausea/vomit 10/19/17   Charlynne Pander, MD  oxyCODONE-acetaminophen (PERCOCET) 10-325 MG tablet Take 1 tablet by mouth every 6 (six) hours as needed for pain.    [provider]  oxyCODONE-acetaminophen (PERCOCET/ROXICET) 5-325 MG tablet Take 1 tablet by mouth every 6 (six) hours as needed for severe pain.    [provider]  Probiotic Product (PROBIOTIC-10 PO) Take 1 tablet by mouth daily.    [provider]  promethazine (PHENERGAN) 25 MG tablet Take 25 mg by mouth every 12 (twelve) hours as needed for nausea or vomiting.    [provider]  tiZANidine (ZANAFLEX) 4 MG tablet Take 4 mg by mouth every 8 (eight) hours as needed for muscle spasms.    [provider]  topiramate  (TOPAMAX) 25 MG tablet Take 25 mg by mouth 2 (two) times daily.    [provider]  zolpidem (AMBIEN) 10 MG tablet Take 10 mg by mouth at bedtime as needed for sleep.    [provider]    Family History History reviewed. No pertinent family history.  Social History Social History   Tobacco Use  . Smoking status: Never Smoker  . Smokeless tobacco: Never Used  Substance Use Topics  . Alcohol use: Yes    Comment: occasional  . Drug use: Never     Allergies   Belimumab; Morphine and related; Shellfish allergy; Sulfa antibiotics; and Toradol [ketorolac tromethamine]   Review of Systems Review of Systems  All other systems reviewed and are negative.    Physical Exam Updated Vital Signs BP (!) 132/99 (BP Location: Right Arm)   Pulse (!) 101   Temp 97.9 F (36.6 C) (Oral)   Resp 18   Ht 1.575 m (5\' 2" )   Wt 56.7 kg (125 lb)   LMP 11/13/2017 (Exact Date)   SpO2 100%   BMI 22.86 kg/m   Physical Exam  Constitutional: She is oriented to person, place, and time. She appears well-developed and well-nourished. No distress.  HENT:  Head: Normocephalic.  Right Ear: External ear normal.  Left Ear: External ear normal.  Nose: Nose normal.  Mouth/Throat: Oropharynx is clear and moist.  Eyes: Pupils are equal, round, and reactive to light. EOM are normal.  Neck: Normal range of motion. Neck supple.  Cardiovascular: Normal rate, regular rhythm, normal heart sounds and intact distal pulses.  Pulmonary/Chest: Effort normal and breath sounds normal.  Port site on left chest reveals no signs of erythema, injection, or fluctuance  Abdominal: Soft. Bowel sounds are normal.  Musculoskeletal: Normal range of motion. She exhibits no edema.  Neurological: She is alert and oriented to person, place, and time.  Skin: Skin is warm and dry. Capillary refill takes less than 2 seconds.  Mild erythematous rash bilateral forearms line no rash noted to face  Psychiatric: She  has a normal mood and affect.  Nursing note and vitals reviewed.    ED Treatments / Results  Labs (all labs ordered are listed, but only abnormal results are displayed) Labs Reviewed  CBC - Abnormal; Notable for the following components:      Result Value   RBC 3.39 (*)    Hemoglobin 9.5 (*)    HCT 32.2 (*)    MCHC 29.5 (*)    All other components within normal limits  URINALYSIS, ROUTINE W REFLEX MICROSCOPIC - Abnormal; Notable for the following components:   Color, Urine STRAW (*)    Specific Gravity, Urine 1.031 (*)    Hgb urine dipstick SMALL (*)    All other components within  normal limits  I-STAT BETA HCG BLOOD, ED ([facility], [facility], [facility] ONLY) - Abnormal; Notable for the following components:   I-stat hCG, quantitative 8.7 (*)    All other components within normal limits  LIPASE, BLOOD  COMPREHENSIVE METABOLIC PANEL    EKG None  Radiology No results found.  Procedures Procedures (including critical care time)  Medications Ordered in ED Medications  sodium chloride flush (NS) 0.9 % injection 10-40 mL (has no administration in time range)  sodium chloride flush (NS) 0.9 % injection 10-40 mL (has no administration in time range)  diphenhydrAMINE (BENADRYL) injection 25 mg (has no administration in time range)  heparin lock flush 100 unit/mL (has no administration in time range)  sodium chloride 0.9 % bolus 1,000 mL (1,000 mLs Intravenous New Bag/Given 11/21/17 0952)  ondansetron (ZOFRAN) injection 4 mg (4 mg Intravenous Given 11/21/17 0948)  fentaNYL (SUBLIMAZE) injection 50 mcg (50 mcg Intravenous Given 11/21/17 0948)     Initial Impression / Assessment and Plan / ED Course  I have reviewed the triage vital signs and the nursing notes.  Pertinent labs & imaging results that were available during my care of the patient were reviewed by me and considered in my medical decision making (see chart for details). Patient informed nursing that she is ready to leave as she is not  getting pain medicine.  She has received fentanyl which is consistent with what she was dosed with the last several times she was in the ED.  She states that she then also received Dilaudid.  I have reviewed the labs are available.  Hemoglobin is decreased from 10.6-9.5.  It appears hemodynamically stable.  Her pregnancy test is slightly elevated at 8.7 which is likely a false positive due to the threshold of test.  Previously was at 14.  I have discussed this laboratory finding with patient and she is aware that she will need to followed up and have a recheck.     Final Clinical Impressions(s) / ED Diagnoses   Final diagnoses:  Lupus (HCC)  Anemia, unspecified type    ED Discharge Orders    None       Margarita Grizzle, MD 11/21/17 1146

## 2017-11-21 NOTE — ED Notes (Signed)
Pt has power port left chest- will access for labs.

## 2017-11-21 NOTE — ED Notes (Signed)
Pt discharged from ED; instructions provided; Pt encouraged to return to ED if symptoms worsen and to f/u with PCP; Pt verbalized understanding of all instructions 

## 2019-02-11 ENCOUNTER — Other Ambulatory Visit: Payer: Self-pay

## 2019-02-11 ENCOUNTER — Encounter (HOSPITAL_BASED_OUTPATIENT_CLINIC_OR_DEPARTMENT_OTHER): Payer: Self-pay

## 2019-02-11 ENCOUNTER — Emergency Department (HOSPITAL_BASED_OUTPATIENT_CLINIC_OR_DEPARTMENT_OTHER)
Admission: EM | Admit: 2019-02-11 | Discharge: 2019-02-12 | Disposition: A | Discharge status: Home / Self Care | Payer: Medicare Other | Attending: Emergency Medicine | Admitting: Emergency Medicine

## 2019-02-11 DIAGNOSIS — Z888 Allergy status to other drugs, medicaments and biological substances status: Secondary | ICD-10-CM | POA: Diagnosis not present

## 2019-02-11 DIAGNOSIS — I10 Essential (primary) hypertension: Secondary | ICD-10-CM | POA: Insufficient documentation

## 2019-02-11 DIAGNOSIS — G4089 Other seizures: Secondary | ICD-10-CM | POA: Insufficient documentation

## 2019-02-11 DIAGNOSIS — M3210 Systemic lupus erythematosus, organ or system involvement unspecified: Secondary | ICD-10-CM | POA: Insufficient documentation

## 2019-02-11 DIAGNOSIS — Z7902 Long term (current) use of antithrombotics/antiplatelets: Secondary | ICD-10-CM | POA: Diagnosis not present

## 2019-02-11 DIAGNOSIS — R569 Unspecified convulsions: Secondary | ICD-10-CM

## 2019-02-11 DIAGNOSIS — Z79899 Other long term (current) drug therapy: Secondary | ICD-10-CM | POA: Insufficient documentation

## 2019-02-11 DIAGNOSIS — Z882 Allergy status to sulfonamides status: Secondary | ICD-10-CM | POA: Insufficient documentation

## 2019-02-11 DIAGNOSIS — Z91013 Allergy to seafood: Secondary | ICD-10-CM | POA: Diagnosis not present

## 2019-02-11 DIAGNOSIS — R112 Nausea with vomiting, unspecified: Secondary | ICD-10-CM | POA: Insufficient documentation

## 2019-02-11 DIAGNOSIS — Z885 Allergy status to narcotic agent status: Secondary | ICD-10-CM | POA: Insufficient documentation

## 2019-02-11 LAB — PREGNANCY, URINE: Preg Test, Ur: NEGATIVE

## 2019-02-11 MED ORDER — FENTANYL CITRATE (PF) 100 MCG/2ML IJ SOLN
50.0000 ug | Freq: Once | INTRAMUSCULAR | Status: AC
  Administered 2019-02-11: 50 ug via INTRAVENOUS
  Filled 2019-02-11: qty 2

## 2019-02-11 MED ORDER — LORAZEPAM 2 MG/ML IJ SOLN
1.0000 mg | Freq: Once | INTRAMUSCULAR | Status: AC
  Administered 2019-02-11: 1 mg via INTRAVENOUS
  Filled 2019-02-11: qty 1

## 2019-02-11 MED ORDER — HYDROMORPHONE HCL 1 MG/ML IJ SOLN
1.0000 mg | Freq: Once | INTRAMUSCULAR | Status: AC
  Administered 2019-02-12: 1 mg via INTRAVENOUS
  Filled 2019-02-11: qty 1

## 2019-02-11 MED ORDER — ONDANSETRON HCL 4 MG/2ML IJ SOLN
4.0000 mg | Freq: Once | INTRAMUSCULAR | Status: AC
  Administered 2019-02-11: 23:00:00 4 mg via INTRAVENOUS
  Filled 2019-02-11: qty 2

## 2019-02-11 MED ORDER — SODIUM CHLORIDE 0.9 % IV BOLUS
1000.0000 mL | Freq: Once | INTRAVENOUS | Status: AC
  Administered 2019-02-11: 1000 mL via INTRAVENOUS

## 2019-02-11 NOTE — ED Notes (Signed)
Pt states Fentanyl usually does not help her pain.

## 2019-02-11 NOTE — ED Provider Notes (Signed)
[facility] Provider Note: Denise DellJ. Lane Antavia Tandy, MD, FACEP  CSN: [id number] MRN: [id number] ARRIVAL: 02/11/19 at 2209 ROOM: [facility]   CHIEF COMPLAINT  Seizure   HISTORY OF PRESENT ILLNESS  02/11/19 10:56 PM Denise Browning is a 28 y.o. female with a history of lupus and seizures.  She is on Keppra.  Her boyfriend was driving her this evening about 9:45 PM when she had 2 back-to-back seizures.  She does not know how long they lasted.  She did not have incontinence or bite her tongue but has been vomiting since.  She is not sure if she hit her head but has a mild headache.  She also has generalized muscle aches which she rates as a 9 out of 10 but denies any focal pain or injury.  She states she is compliant with her medications.  She has not been sick recently and does not know why she seized.  Past Medical History:  Diagnosis Date  . Clotting disorder (HCC)   . Hypertension   . Lupus (HCC)   . Osteoporosis   . Seizures (HCC)     Past Surgical History:  Procedure Laterality Date  . NECK SURGERY    . PEG TUBE PLACEMENT    . power port placement    . TOOTH EXTRACTION      No family history on file.  Social History   Tobacco Use  . Smoking status: Never Smoker  . Smokeless tobacco: Never Used  Substance Use Topics  . Alcohol use: Yes    Comment: occasional  . Drug use: Never    Prior to Admission medications   Medication Sig Start Date End Date Taking? Authorizing Provider  carvedilol (COREG) 6.25 MG tablet Take 6.25 mg by mouth 2 (two) times daily with a meal.   Yes [provider]  lisinopril (ZESTRIL) 10 MG tablet Take 10 mg by mouth daily.   Yes [provider]  predniSONE (DELTASONE) 10 MG tablet Take 10 mg by mouth daily with breakfast.   Yes [provider]  cetirizine (ZYRTEC) 10 MG tablet Take 10 mg by mouth as needed for allergies.    [provider]  cloNIDine (CATAPRES) 0.3 MG tablet Take 0.3 mg by  mouth 2 (two) times daily.    [provider]  fondaparinux (ARIXTRA) 7.5 MG/0.6ML SOLN injection Inject 7.5 mg into the skin daily.    [provider]  hydrocortisone cream 1 % Apply 1 application topically as needed for itching.    [provider]  hydroxychloroquine (PLAQUENIL) 200 MG tablet Take 200 mg by mouth 2 (two) times daily.    [provider]  ibuprofen (ADVIL,MOTRIN) 600 MG tablet Take 600 mg by mouth every 6 (six) hours as needed for moderate pain.    [provider]  levETIRAcetam (KEPPRA) 100 MG/ML solution Take 1,500 mg by mouth 2 (two) times daily.     [provider]  Multiple Vitamin (MULTIVITAMIN WITH MINERALS) TABS tablet Take 1 tablet by mouth daily.    [provider]  mycophenolate (CELLCEPT) 200 MG/ML suspension Take 500 mg by mouth 2 (two) times daily.     [provider]  ondansetron (ZOFRAN ODT) 4 MG disintegrating tablet 4mg  ODT q4 hours prn nausea/vomit 10/19/17   Charlynne PanderYao, David Hsienta, MD  oxyCODONE-acetaminophen (PERCOCET) 10-325 MG tablet Take 1 tablet by mouth every 6 (six) hours as needed for pain.    [provider]  oxyCODONE-acetaminophen (PERCOCET/ROXICET) 5-325 MG tablet Take 1  tablet by mouth every 6 (six) hours as needed for severe pain.    [provider]  Probiotic Product (PROBIOTIC-10 PO) Take 1 tablet by mouth daily.    [provider]  promethazine (PHENERGAN) 25 MG tablet Take 25 mg by mouth every 12 (twelve) hours as needed for nausea or vomiting.    [provider]  tiZANidine (ZANAFLEX) 4 MG tablet Take 4 mg by mouth every 8 (eight) hours as needed for muscle spasms.    [provider]  zolpidem (AMBIEN) 10 MG tablet Take 10 mg by mouth at bedtime as needed for sleep.    [provider]    Allergies Belimumab, Morphine and related, Shellfish allergy, Sulfa antibiotics, and Toradol [ketorolac tromethamine]   REVIEW OF SYSTEMS   Negative except as noted here or in the History of Present Illness.   PHYSICAL EXAMINATION  Initial Vital Signs Blood pressure (!) 169/122, pulse (!) 102, temperature 98.7 F (37.1 C), temperature source Oral, resp. rate 20, height 5\' 2"  (1.575 m), weight 65.8 kg, last menstrual period 02/01/2019, SpO2 100 %.  Examination General: Well-developed, well-nourished female in no acute distress; appearance consistent with age of record HENT: normocephalic; no scalp or facial hematomas seen or palpated Eyes: pupils equal, round and reactive to light; extraocular muscles intact Neck: supple Heart: regular rate and rhythm Lungs: clear to auscultation bilaterally Abdomen: soft; nondistended; nontender; bowel sounds present Extremities: No deformity; full range of motion; pulses normal; mild generalized muscle tenderness. Neurologic: Awake, alert and oriented; motor function intact in all extremities and symmetric; no facial droop Skin: Warm and dry Psychiatric: Normal mood and affect   RESULTS  Summary of this visit's results, reviewed and interpreted by myself:   EKG Interpretation  Date/Time:    Ventricular Rate:    PR Interval:    QRS Duration:   QT Interval:    QTC Calculation:   R Axis:     Text Interpretation:        Laboratory Studies: Results for orders placed or performed during the hospital encounter of 02/11/19 (from the past 24 hour(s))  Pregnancy, urine     Status: None   Collection Time: 02/11/19 10:45 PM  Result Value Ref Range   Preg Test, Ur NEGATIVE NEGATIVE  CK     Status: None   Collection Time: 02/11/19 11:40 PM  Result Value Ref Range   Total CK 40 38 - 234 U/L  Basic metabolic panel     Status: Abnormal   Collection Time: 02/11/19 11:40 PM  Result Value Ref Range   Sodium 136 135 - 145 mmol/L   Potassium 4.0 3.5 - 5.1 mmol/L   Chloride 105 98 - 111 mmol/L   CO2 23 22 - 32 mmol/L   Glucose, Bld 94 70 - 99 mg/dL   BUN 15 6 - 20 mg/dL   Creatinine,  Ser 0.59 0.44 - 1.00 mg/dL   Calcium 8.7 (L) 8.9 - 10.3 mg/dL   GFR calc non Af Amer >60 >60 mL/min   GFR calc Af Amer >60 >60 mL/min   Anion gap 8 5 - 15  CBC with Differential/Platelet     Status: Abnormal   Collection Time: 02/11/19 11:40 PM  Result Value Ref Range   WBC 7.6 4.0 - 10.5 K/uL   RBC 3.64 (L) 3.87 - 5.11 MIL/uL   Hemoglobin 10.5 (L) 12.0 - 15.0 g/dL   HCT 34.5 (L) 36.0 - 46.0 %   MCV 94.8 80.0 - 100.0 fL  MCH 28.8 26.0 - 34.0 pg   MCHC 30.4 30.0 - 36.0 g/dL   RDW 75.4 36.0 - 67.7 %   Platelets 244 150 - 400 K/uL   nRBC 0.0 0.0 - 0.2 %   Neutrophils Relative % 64 %   Neutro Abs 4.9 1.7 - 7.7 K/uL   Lymphocytes Relative 28 %   Lymphs Abs 2.1 0.7 - 4.0 K/uL   Monocytes Relative 8 %   Monocytes Absolute 0.6 0.1 - 1.0 K/uL   Eosinophils Relative 0 %   Eosinophils Absolute 0.0 0.0 - 0.5 K/uL   Basophils Relative 0 %   Basophils Absolute 0.0 0.0 - 0.1 K/uL   WBC Morphology MORPHOLOGY UNREMARKABLE    RBC Morphology MORPHOLOGY UNREMARKABLE    Smear Review Normal platelet morphology    Immature Granulocytes 0 %   Abs Immature Granulocytes 0.02 0.00 - 0.07 K/uL   Imaging Studies: No results found.  ED COURSE and MDM  Nursing notes, initial and subsequent vitals signs, including pulse oximetry, reviewed and interpreted by myself.  Vitals:   02/11/19 2245 02/11/19 2330 02/12/19 0030 02/12/19 0130  BP: (!) 150/128 (!) 143/124 (!) 168/123 (!) 157/115  Pulse: (!) 110 100 92 99  Resp: (!) 22 17 (!) 22 (!) 22  Temp:      TempSrc:      SpO2: 95% 100% 97% 99%  Weight:      Height:       Medications  0.9 %  sodium chloride infusion ( Intravenous New Bag/Given 02/12/19 0054)  sodium chloride 0.9 % bolus 1,000 mL (0 mLs Intravenous Stopped 02/12/19 0043)  LORazepam (ATIVAN) injection 1 mg (1 mg Intravenous Given 02/11/19 2330)  ondansetron (ZOFRAN) injection 4 mg (4 mg Intravenous Given 02/11/19 2321)  fentaNYL (SUBLIMAZE) injection 50 mcg (50 mcg Intravenous Given  02/11/19 2321)  HYDROmorphone (DILAUDID) injection 1 mg (1 mg Intravenous Given 02/12/19 0001)  HYDROmorphone (DILAUDID) injection 1 mg (1 mg Intravenous Given 02/12/19 0139)   1:57 AM Patient feeling better.  No vomiting or seizure activity while in the ED.  She states this is a typical pattern for her after a seizure apart from the vomiting.  She intends to go home and sleep the next 24 hours which is her custom.  No evidence of rhabdomyolysis per CK.   PROCEDURES  Procedures   ED DIAGNOSES     ICD-10-CM   1. Seizure (HCC)  R56.9   2. Nausea and vomiting in adult  R11.2        Ciarah Peace, Jonny Ruiz, MD 02/12/19 [phone]

## 2019-02-11 NOTE — ED Notes (Signed)
Port accessed at 2330.

## 2019-02-11 NOTE — ED Triage Notes (Signed)
Pt reports seizure x this pm with vomiting-hit head on car door-pain to right side of head-NAD-to triage in w/c

## 2019-02-12 DIAGNOSIS — G4089 Other seizures: Secondary | ICD-10-CM | POA: Diagnosis not present

## 2019-02-12 LAB — CBC WITH DIFFERENTIAL/PLATELET
Abs Immature Granulocytes: 0.02 10*3/uL (ref 0.00–0.07)
Basophils Absolute: 0 10*3/uL (ref 0.0–0.1)
Basophils Relative: 0 %
Eosinophils Absolute: 0 10*3/uL (ref 0.0–0.5)
Eosinophils Relative: 0 %
HCT: 34.5 % — ABNORMAL LOW (ref 36.0–46.0)
Hemoglobin: 10.5 g/dL — ABNORMAL LOW (ref 12.0–15.0)
Immature Granulocytes: 0 %
Lymphocytes Relative: 28 %
Lymphs Abs: 2.1 10*3/uL (ref 0.7–4.0)
MCH: 28.8 pg (ref 26.0–34.0)
MCHC: 30.4 g/dL (ref 30.0–36.0)
MCV: 94.8 fL (ref 80.0–100.0)
Monocytes Absolute: 0.6 10*3/uL (ref 0.1–1.0)
Monocytes Relative: 8 %
Neutro Abs: 4.9 10*3/uL (ref 1.7–7.7)
Neutrophils Relative %: 64 %
Platelets: 244 10*3/uL (ref 150–400)
RBC: 3.64 MIL/uL — ABNORMAL LOW (ref 3.87–5.11)
RDW: 14.5 % (ref 11.5–15.5)
Smear Review: NORMAL
WBC: 7.6 10*3/uL (ref 4.0–10.5)
nRBC: 0 % (ref 0.0–0.2)

## 2019-02-12 LAB — BASIC METABOLIC PANEL
Anion gap: 8 (ref 5–15)
BUN: 15 mg/dL (ref 6–20)
CO2: 23 mmol/L (ref 22–32)
Calcium: 8.7 mg/dL — ABNORMAL LOW (ref 8.9–10.3)
Chloride: 105 mmol/L (ref 98–111)
Creatinine, Ser: 0.59 mg/dL (ref 0.44–1.00)
GFR calc Af Amer: >60 mL/min (ref >60)
GFR calc non Af Amer: >60 mL/min (ref >60)
Glucose, Bld: 94 mg/dL (ref 70–99)
Potassium: 4 mmol/L (ref 3.5–5.1)
Sodium: 136 mmol/L (ref 135–145)

## 2019-02-12 LAB — CK: Total CK: 40 U/L (ref 38–234)

## 2019-02-12 MED ORDER — SODIUM CHLORIDE 0.9 % IV SOLN
INTRAVENOUS | Status: DC | PRN
  Administered 2019-02-12: 01:00:00 via INTRAVENOUS

## 2019-02-12 MED ORDER — HEPARIN SOD (PORK) LOCK FLUSH 100 UNIT/ML IV SOLN
500.0000 [IU] | Freq: Once | INTRAVENOUS | Status: AC
  Administered 2019-02-12: 03:00:00 500 [IU]
  Filled 2019-02-12: qty 5

## 2019-02-12 MED ORDER — HYDROMORPHONE HCL 1 MG/ML IJ SOLN
1.0000 mg | Freq: Once | INTRAMUSCULAR | Status: AC
  Administered 2019-02-12: 1 mg via INTRAVENOUS
  Filled 2019-02-12: qty 1

## 2019-02-12 NOTE — ED Notes (Signed)
Seizure pads to bed rails

## 2019-02-12 NOTE — ED Notes (Signed)
Pt requesting 1 more dose of pain medication. MD aware. No seizure activity since arrival.

## 2019-02-12 NOTE — ED Notes (Signed)
Pt states she has taken her b/p meds today. Pt resting comfortably in stretcher texting on phone. No acute distress noted.

## 2020-02-06 ENCOUNTER — Emergency Department (HOSPITAL_COMMUNITY): Payer: 59

## 2020-02-06 ENCOUNTER — Encounter (HOSPITAL_COMMUNITY): Payer: Self-pay

## 2020-02-06 ENCOUNTER — Other Ambulatory Visit: Payer: Self-pay

## 2020-02-06 ENCOUNTER — Emergency Department (HOSPITAL_COMMUNITY)
Admission: EM | Admit: 2020-02-06 | Discharge: 2020-02-07 | Disposition: A | Discharge status: Home / Self Care | Payer: 59 | Attending: Emergency Medicine | Admitting: Emergency Medicine

## 2020-02-06 DIAGNOSIS — I6381 Other cerebral infarction due to occlusion or stenosis of small artery: Secondary | ICD-10-CM | POA: Diagnosis not present

## 2020-02-06 DIAGNOSIS — N649 Disorder of breast, unspecified: Secondary | ICD-10-CM | POA: Insufficient documentation

## 2020-02-06 DIAGNOSIS — I10 Essential (primary) hypertension: Secondary | ICD-10-CM | POA: Insufficient documentation

## 2020-02-06 DIAGNOSIS — R112 Nausea with vomiting, unspecified: Secondary | ICD-10-CM | POA: Diagnosis not present

## 2020-02-06 DIAGNOSIS — Z79899 Other long term (current) drug therapy: Secondary | ICD-10-CM | POA: Diagnosis not present

## 2020-02-06 DIAGNOSIS — R569 Unspecified convulsions: Secondary | ICD-10-CM | POA: Diagnosis present

## 2020-02-06 DIAGNOSIS — N6320 Unspecified lump in the left breast, unspecified quadrant: Secondary | ICD-10-CM | POA: Diagnosis not present

## 2020-02-06 DIAGNOSIS — Z20822 Contact with and (suspected) exposure to covid-19: Secondary | ICD-10-CM | POA: Diagnosis not present

## 2020-02-06 DIAGNOSIS — G40909 Epilepsy, unspecified, not intractable, without status epilepticus: Secondary | ICD-10-CM | POA: Diagnosis not present

## 2020-02-06 DIAGNOSIS — I6782 Cerebral ischemia: Secondary | ICD-10-CM | POA: Insufficient documentation

## 2020-02-06 LAB — CBC
HCT: 36.6 % (ref 36.0–46.0)
Hemoglobin: 10.7 g/dL — ABNORMAL LOW (ref 12.0–15.0)
MCH: 28.1 pg (ref 26.0–34.0)
MCHC: 29.2 g/dL — ABNORMAL LOW (ref 30.0–36.0)
MCV: 96.1 fL (ref 80.0–100.0)
Platelets: 164 10*3/uL (ref 150–400)
RBC: 3.81 MIL/uL — ABNORMAL LOW (ref 3.87–5.11)
RDW: 14.2 % (ref 11.5–15.5)
WBC: 9.6 10*3/uL (ref 4.0–10.5)
nRBC: 0 % (ref 0.0–0.2)

## 2020-02-06 LAB — I-STAT BETA HCG BLOOD, ED (MC, WL, AP ONLY): I-stat hCG, quantitative: <5 m[IU]/mL (ref <5)

## 2020-02-06 LAB — BASIC METABOLIC PANEL
Anion gap: 14 (ref 5–15)
BUN: 13 mg/dL (ref 6–20)
CO2: 23 mmol/L (ref 22–32)
Calcium: 9.2 mg/dL (ref 8.9–10.3)
Chloride: 100 mmol/L (ref 98–111)
Creatinine, Ser: 0.75 mg/dL (ref 0.44–1.00)
GFR, Estimated: >60 mL/min (ref >60)
Glucose, Bld: 101 mg/dL — ABNORMAL HIGH (ref 70–99)
Potassium: 3.2 mmol/L — ABNORMAL LOW (ref 3.5–5.1)
Sodium: 137 mmol/L (ref 135–145)

## 2020-02-06 LAB — RESPIRATORY PANEL BY RT PCR (FLU A&B, COVID)
Influenza A by PCR: NEGATIVE
Influenza B by PCR: NEGATIVE
SARS Coronavirus 2 by RT PCR: NEGATIVE

## 2020-02-06 IMAGING — MR MR HEAD WO/W CM
8 of 17 series · 24 of 48 positions shown · IV contrast (gadavist)
Comparison: Prior CT from earlier the same day.
COMPARISON: Prior CT from earlier the same day.

Addendum:
CLINICAL DATA: Initial evaluation for neuro deficit, stroke
suspected.

EXAM:
MRI HEAD WITHOUT CONTRAST
MRA HEAD WITHOUT CONTRAST
MRA NECK WITHOUT AND WITH CONTRAST
TECHNIQUE: Multiplanar, multiecho pulse sequences of the brain and surrounding
structures were obtained without intravenous contrast. Angiographic
images of the Circle of Willis were obtained using MRA technique
without intravenous contrast. Angiographic images of the neck were
obtained using MRA technique without and with intravenous contrast.
Carotid stenosis measurements (when applicable) are obtained
utilizing NASCET criteria, using the distal internal carotid
diameter as the denominator.
CONTRAST:  6mL GADAVIST GADOBUTROL 1 MMOL/ML IV SOLN

[Series 5: DWI · coronal · 4.0mm · 0.88mm/px · 5 of 76 slices shown (1 of 4)]
[im 1/76]
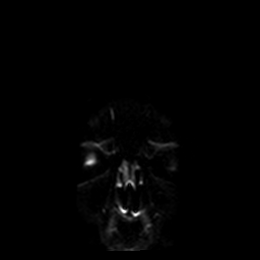
[im 19/76]
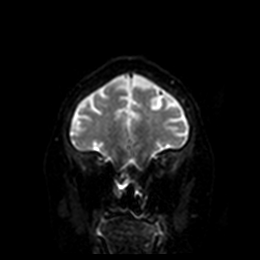
[im 38/76]
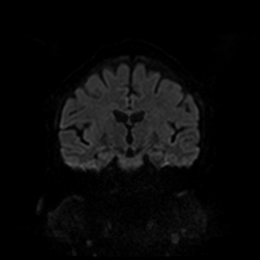
[im 57/76]
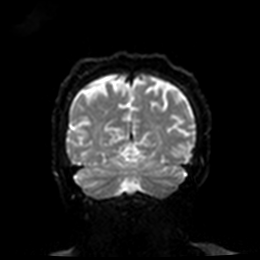
[im 76/76]
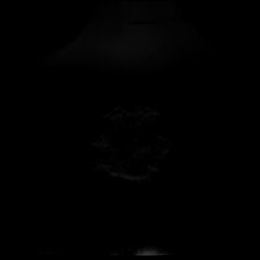

[Series 6: DWI · coronal · 4.0mm · 0.88mm/px · 2 of 38 slices shown (2 of 4)]
[im 1/38]
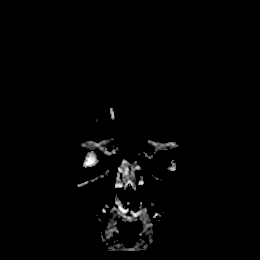
[im 38/38]
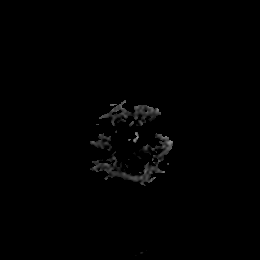

[Series 7: FLAIR · axial · 5.0mm · 0.45mm/px · z∈[-110,+32]mm · 2 of 25 slices shown]
[im 1/25]
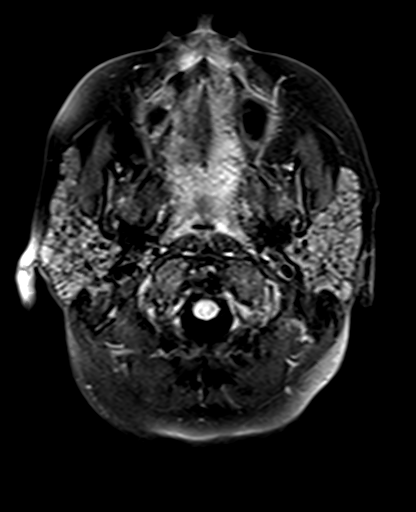
[im 25/25]
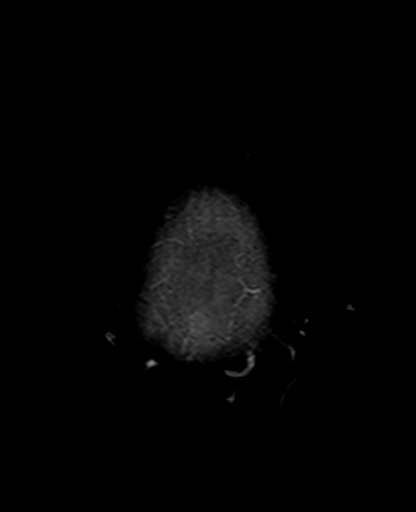

[Series 14: T2 · axial · 5.0mm · 0.72mm/px · z∈[-109,+33]mm · 2 of 25 slices shown]
[im 1/25]
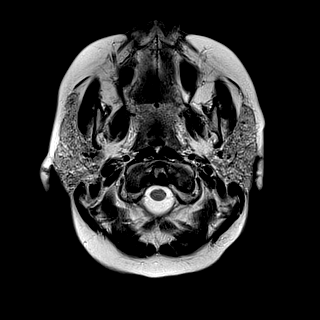
[im 25/25]
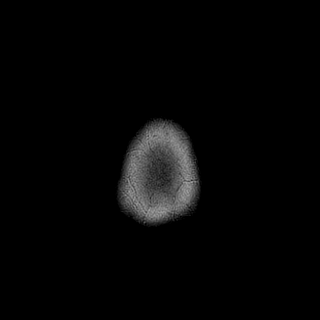

[Series 17: DWI · axial · 3.0mm · 0.88mm/px · z∈[-114,+37]mm · 6 of 104 slices shown (3 of 4)]
[im 1/104]
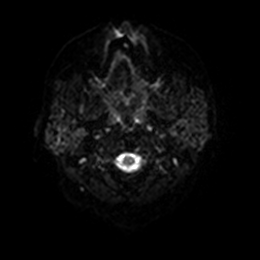
[im 21/104]
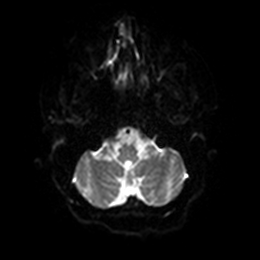
[im 42/104]
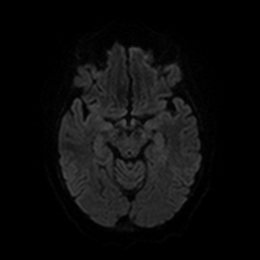
[im 62/104]
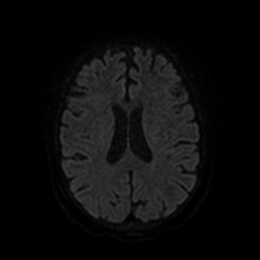
[im 83/104]
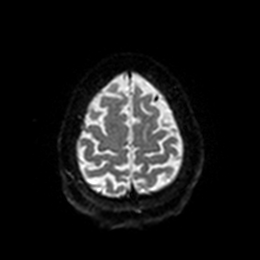
[im 104/104]
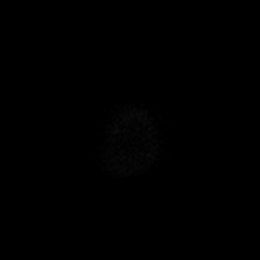

[Series 18: DWI · axial · 3.0mm · 0.88mm/px · z∈[-114,+37]mm · 3 of 52 slices shown (4 of 4)]
[im 1/52]
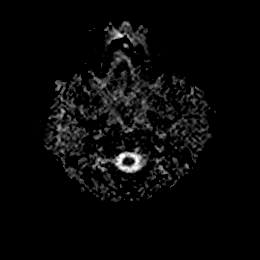
[im 26/52]
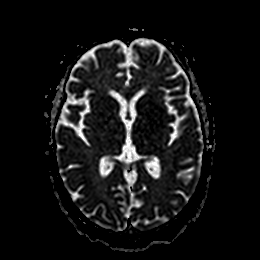
[im 52/52]
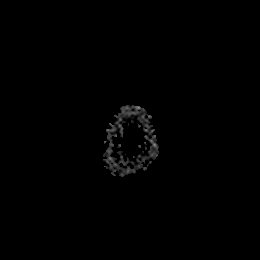

[Series 29: T1 post-contrast · coronal · 5.0mm · 0.34mm/px · 2 of 30 slices shown (1 of 2)]
[im 1/30]
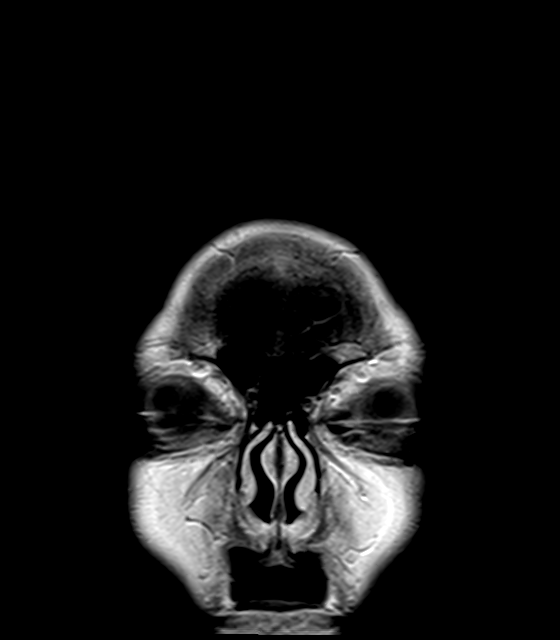
[im 30/30]
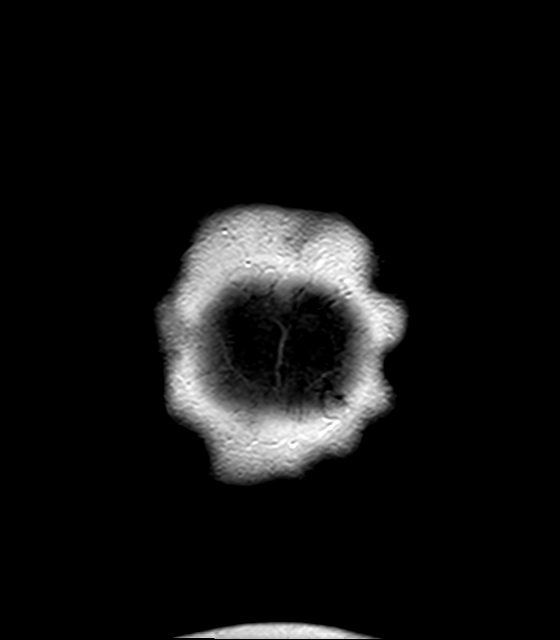

[Series 30: T1 post-contrast · sagittal · 5.0mm · 0.72mm/px · 2 of 25 slices shown (2 of 2)]
[im 1/25]
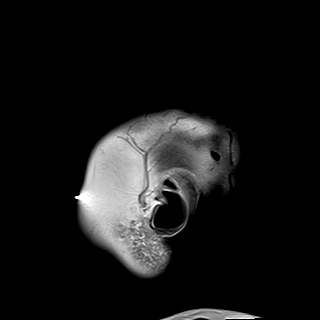
[im 25/25]
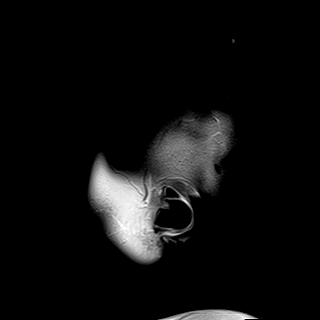

[24 of 48 positions shown; findings below may reference images not displayed]

FINDINGS: MRI HEAD FINDINGS

Brain: Cerebral volume within normal limits for patient age. Focal
linear area of T2/FLAIR signal abnormality seen radiating from the
right frontal periventricular white matter to the overlying right
frontal cortex, corresponding with abnormality seen on prior CT
(series 7, images 15-18). Finding most characteristic of chronic
encephalomalacia and gliosis. Small amount of chronic hemosiderin
staining noted as well. Finding felt to be most consistent with a
chronic ischemic infarct. Minimal T2/FLAIR hyperintensity noted
within the periventricular white matter, nonspecific, but suspected
to be related to underlying chronic microvascular ischemic disease,
mildly advanced for age. Additionally, there is a suspected subtle
punctate remote left thalamic lacunar infarct (series 14, image 13).

No abnormal foci of restricted diffusion to suggest acute or
subacute ischemia. Gray-white matter differentiation otherwise well
maintained. No other areas of chronic infarction. No other foci of
susceptibility artifact to suggest acute or chronic intracranial
hemorrhage.

No mass lesion, midline shift or mass effect. No hydrocephalus. No
extra-axial fluid collection. Major dural sinuses are grossly
patent.

Pituitary gland and suprasellar region are normal. Midline
structures intact and normal.

No abnormal enhancement seen within the brain itself. Subtle
asymmetric smooth dural thickening and enhancement noted overlying
the left cerebral hemisphere, of uncertain etiology or significance,
and may be post interventional in nature.

Vascular: Major intracranial vascular flow voids well maintained and
normal in appearance.

Skull and upper cervical spine: Craniocervical junction normal.
Visualized upper cervical spine within normal limits. Bone marrow
signal intensity normal. No scalp soft tissue abnormality.

Sinuses/Orbits: Globes and orbital soft tissues within normal
limits.

Mild scattered mucosal thickening seen about the ethmoidal air
cells. Paranasal sinuses are otherwise clear. No mastoid effusion.
Inner ear structures grossly normal.

Other: None.

MRA HEAD FINDINGS

ANTERIOR CIRCULATION:

Examination somewhat degraded by motion artifact.

Distal cervical segments of the internal carotid arteries are patent
with symmetric antegrade flow. Petrous, cavernous, and supraclinoid
segments patent bilaterally without significant stenosis or other
abnormality. A1 segments patent bilaterally. Normal anterior
communicating artery complex. Anterior cerebral arteries patent to
their distal aspects without stenosis. No M1 stenosis or occlusion.
Normal MCA bifurcations. Distal MCA branches well perfused and
symmetric.

POSTERIOR CIRCULATION:

Vertebral arteries largely codominant and patent to the
vertebrobasilar junction. Both PICAs patent. Basilar patent to its
distal aspect without appreciable stenosis. Superior cerebral
arteries patent bilaterally. Both PCAs supplied via the basilar as
well as prominent bilateral posterior communicating arteries. PCAs
well perfused to their distal aspects.

No intracranial aneurysm or other vascular abnormality.

MRA NECK FINDINGS

AORTIC ARCH: Examination mildly limited by lack of IV contrast.

Visualized aortic arch normal caliber with normal branch pattern. No
hemodynamically significant stenosis seen about the origin of the
great vessels.

RIGHT CAROTID SYSTEM: Right common and internal carotid arteries
widely patent without stenosis, occlusion, or evidence for
dissection. No significant atheromatous irregularity or narrowing
about the right carotid bifurcation.

LEFT CAROTID SYSTEM: Left common and internal carotid arteries
widely patent without stenosis, occlusion, or evidence for
dissection. No significant atheromatous irregularity or narrowing
about the left carotid bifurcation.

VERTEBRAL ARTERIES: Both vertebral arteries arise from the
subclavian arteries. No visible proximal subclavian artery stenosis.
Vertebral arteries patent within the neck without stenosis,
occlusion, or evidence for dissection.

There is an apparent 2.5 cm lesion within the left breast (series
10, image 63), indeterminate.
IMPRESSION: MRI HEAD IMPRESSION:

1. No acute intracranial abnormality.
2. Focal area of encephalomalacia and gliosis involving the cortical
and subcortical right frontal lobe, corresponding with abnormality
on prior CT. Appearance is most consistent with a chronic ischemic
infarct, right MCA distribution.
3. Additional subtle punctate remote lacunar infarct involving the
left thalamus.
4. Underlying mild chronic small vessel ischemic disease, advanced
for age.
5. Subtle asymmetric dural thickening and enhancement overlying the
left cerebral hemisphere, of uncertain significance or etiology, and
could be related to prior intervention. Correlation with LP and CSF
thigh use could be performed for further evaluation as clinically
warranted.

MRA HEAD IMPRESSION:

Normal intracranial MRA. No large vessel occlusion, hemodynamically
significant stenosis, or other acute vascular abnormality. No
aneurysm.

MRA NECK IMPRESSION:

1. Normal MRA of the neck. No hemodynamically significant stenosis
or other acute vascular abnormality.
2. Apparent 2.5 cm lesion within the left breast tissue,
indeterminate. Correlation with physical exam and dedicated breast
ultrasound recommended for further evaluation.

ADDENDUM:
In addition to the initially described findings, note is made of a
chronic odontoid fracture with lag fixation screw in place.
Additionally, the pituitary gland is mildly prominent with convex
border superiorly, but no discrete pituitary mass or other lesion.

*** End of Addendum ***
FINDINGS: MRI HEAD FINDINGS

Brain: Cerebral volume within normal limits for patient age. Focal
linear area of T2/FLAIR signal abnormality seen radiating from the
right frontal periventricular white matter to the overlying right
frontal cortex, corresponding with abnormality seen on prior CT
(series 7, images 15-18). Finding most characteristic of chronic
encephalomalacia and gliosis. Small amount of chronic hemosiderin
staining noted as well. Finding felt to be most consistent with a
chronic ischemic infarct. Minimal T2/FLAIR hyperintensity noted
within the periventricular white matter, nonspecific, but suspected
to be related to underlying chronic microvascular ischemic disease,
mildly advanced for age. Additionally, there is a suspected subtle
punctate remote left thalamic lacunar infarct (series 14, image 13).

No abnormal foci of restricted diffusion to suggest acute or
subacute ischemia. Gray-white matter differentiation otherwise well
maintained. No other areas of chronic infarction. No other foci of
susceptibility artifact to suggest acute or chronic intracranial
hemorrhage.

No mass lesion, midline shift or mass effect. No hydrocephalus. No
extra-axial fluid collection. Major dural sinuses are grossly
patent.

Pituitary gland and suprasellar region are normal. Midline
structures intact and normal.

No abnormal enhancement seen within the brain itself. Subtle
asymmetric smooth dural thickening and enhancement noted overlying
the left cerebral hemisphere, of uncertain etiology or significance,
and may be post interventional in nature.

Vascular: Major intracranial vascular flow voids well maintained and
normal in appearance.

Skull and upper cervical spine: Craniocervical junction normal.
Visualized upper cervical spine within normal limits. Bone marrow
signal intensity normal. No scalp soft tissue abnormality.

Sinuses/Orbits: Globes and orbital soft tissues within normal
limits.

Mild scattered mucosal thickening seen about the ethmoidal air
cells. Paranasal sinuses are otherwise clear. No mastoid effusion.
Inner ear structures grossly normal.

Other: None.

MRA HEAD FINDINGS

ANTERIOR CIRCULATION:

Examination somewhat degraded by motion artifact.

Distal cervical segments of the internal carotid arteries are patent
with symmetric antegrade flow. Petrous, cavernous, and supraclinoid
segments patent bilaterally without significant stenosis or other
abnormality. A1 segments patent bilaterally. Normal anterior
communicating artery complex. Anterior cerebral arteries patent to
their distal aspects without stenosis. No M1 stenosis or occlusion.
Normal MCA bifurcations. Distal MCA branches well perfused and
symmetric.

POSTERIOR CIRCULATION:

Vertebral arteries largely codominant and patent to the
vertebrobasilar junction. Both PICAs patent. Basilar patent to its
distal aspect without appreciable stenosis. Superior cerebral
arteries patent bilaterally. Both PCAs supplied via the basilar as
well as prominent bilateral posterior communicating arteries. PCAs
well perfused to their distal aspects.

No intracranial aneurysm or other vascular abnormality.

MRA NECK FINDINGS

AORTIC ARCH: Examination mildly limited by lack of IV contrast.

Visualized aortic arch normal caliber with normal branch pattern. No
hemodynamically significant stenosis seen about the origin of the
great vessels.

RIGHT CAROTID SYSTEM: Right common and internal carotid arteries
widely patent without stenosis, occlusion, or evidence for
dissection. No significant atheromatous irregularity or narrowing
about the right carotid bifurcation.

LEFT CAROTID SYSTEM: Left common and internal carotid arteries
widely patent without stenosis, occlusion, or evidence for
dissection. No significant atheromatous irregularity or narrowing
about the left carotid bifurcation.

VERTEBRAL ARTERIES: Both vertebral arteries arise from the
subclavian arteries. No visible proximal subclavian artery stenosis.
Vertebral arteries patent within the neck without stenosis,
occlusion, or evidence for dissection.

There is an apparent 2.5 cm lesion within the left breast (series
10, image 63), indeterminate.
IMPRESSION: MRI HEAD IMPRESSION:

1. No acute intracranial abnormality.
2. Focal area of encephalomalacia and gliosis involving the cortical
and subcortical right frontal lobe, corresponding with abnormality
on prior CT. Appearance is most consistent with a chronic ischemic
infarct, right MCA distribution.
3. Additional subtle punctate remote lacunar infarct involving the
left thalamus.
4. Underlying mild chronic small vessel ischemic disease, advanced
for age.
5. Subtle asymmetric dural thickening and enhancement overlying the
left cerebral hemisphere, of uncertain significance or etiology, and
could be related to prior intervention. Correlation with LP and CSF
thigh use could be performed for further evaluation as clinically
warranted.

MRA HEAD IMPRESSION:

Normal intracranial MRA. No large vessel occlusion, hemodynamically
significant stenosis, or other acute vascular abnormality. No
aneurysm.

MRA NECK IMPRESSION:

1. Normal MRA of the neck. No hemodynamically significant stenosis
or other acute vascular abnormality.
2. Apparent 2.5 cm lesion within the left breast tissue,
indeterminate. Correlation with physical exam and dedicated breast
ultrasound recommended for further evaluation.

## 2020-02-06 IMAGING — CT CT HEAD W/O CM
4 series · 16 of 47 positions shown, 18 images · non-contrast
Comparison: [DATE]

CLINICAL DATA: Seizure, fall

EXAM:
CT HEAD WITHOUT CONTRAST
TECHNIQUE: Contiguous axial images were obtained from the base of the skull
through the vertex without intravenous contrast.

[Series 3: head wo · axial · 0.39mm/px · z∈[-110,-5]mm · 7 of 29 slices shown, 9 images]
[im 4/29  brain]
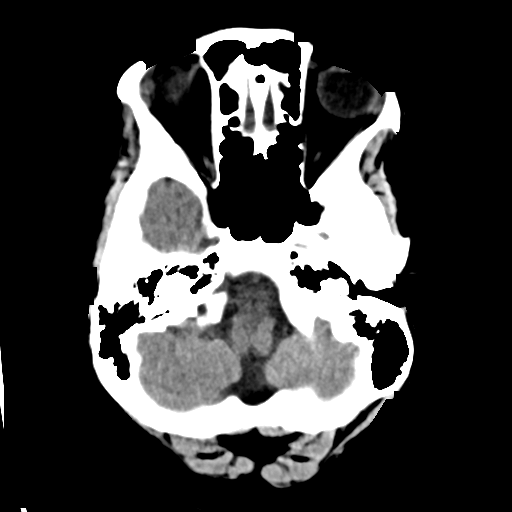
[im 4/29  bone]
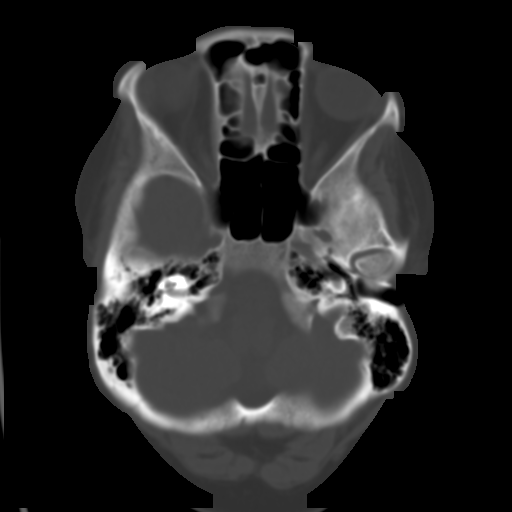
[im 8/29  brain]
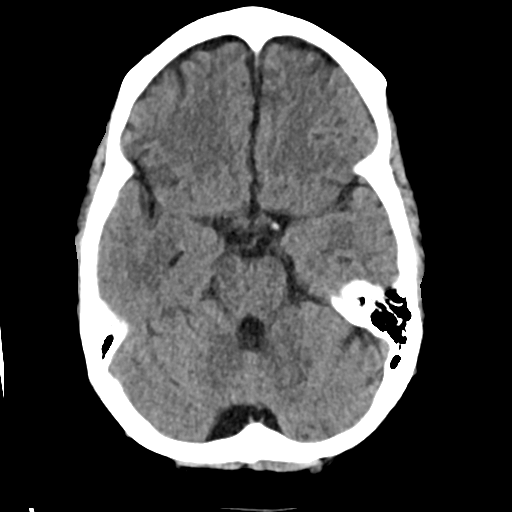
[im 11/29  brain]
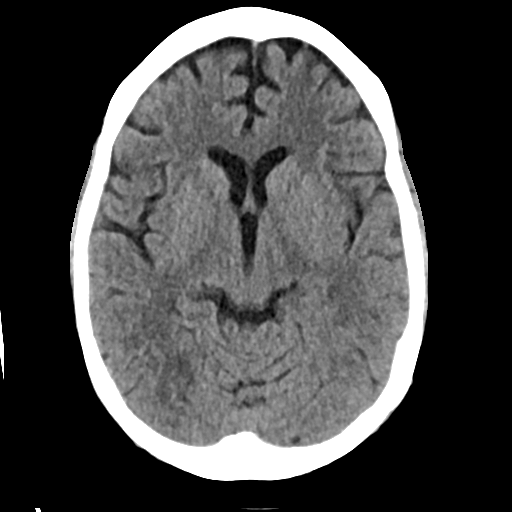
[im 15/29  brain]
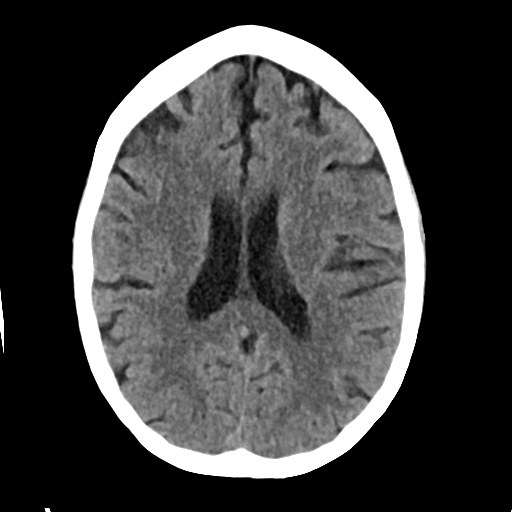
[im 18/29  brain]
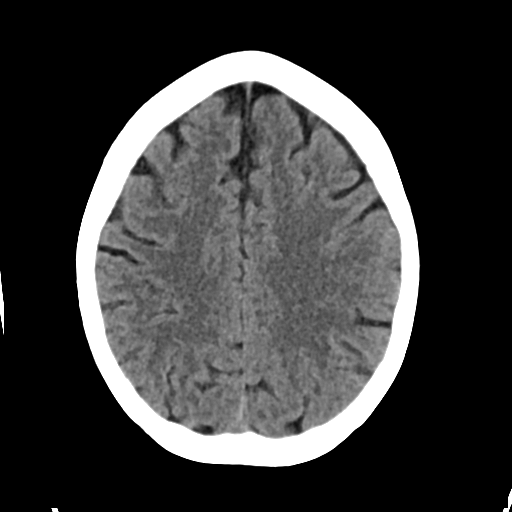
[im 18/29  bone]
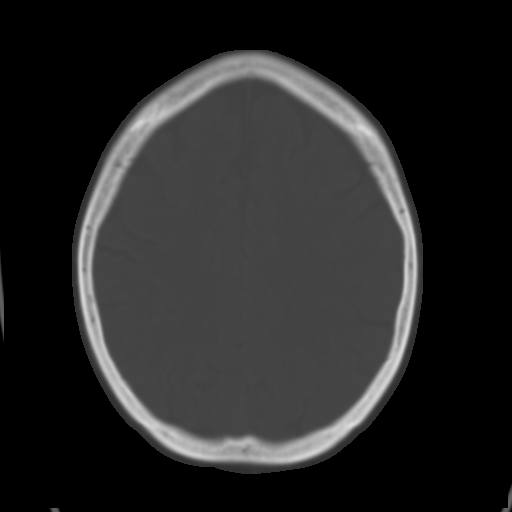
[im 22/29  brain]
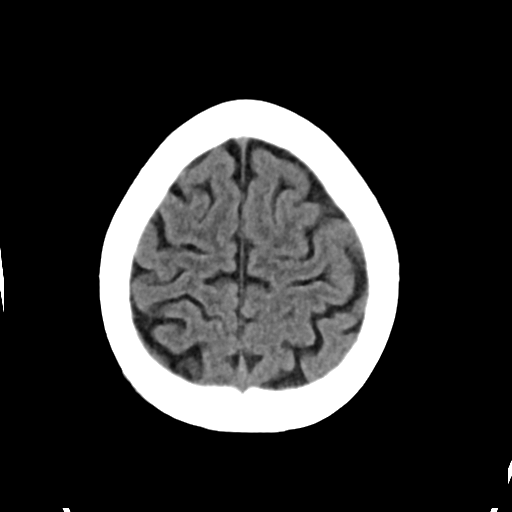
[im 25/29  brain]
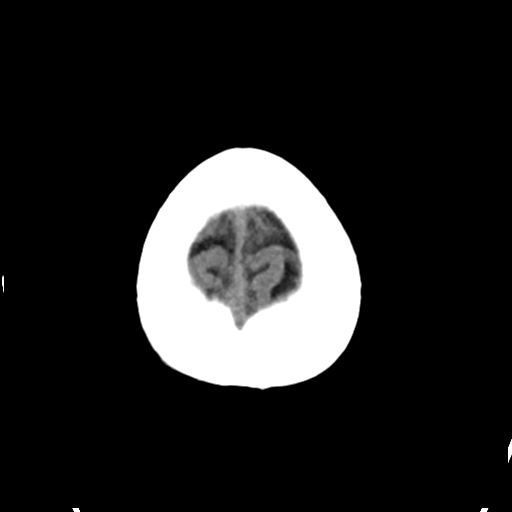

[Series 4: head bone · axial · 0.39mm/px · z∈[-111,-83]mm · 3 of 72 slices shown]
[im 8/72  bone]
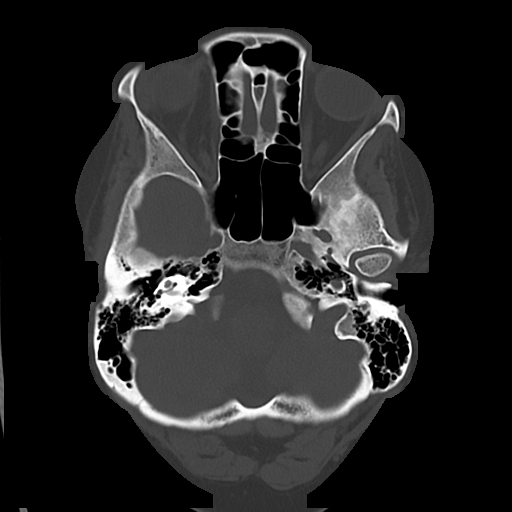
[im 15/72  bone]
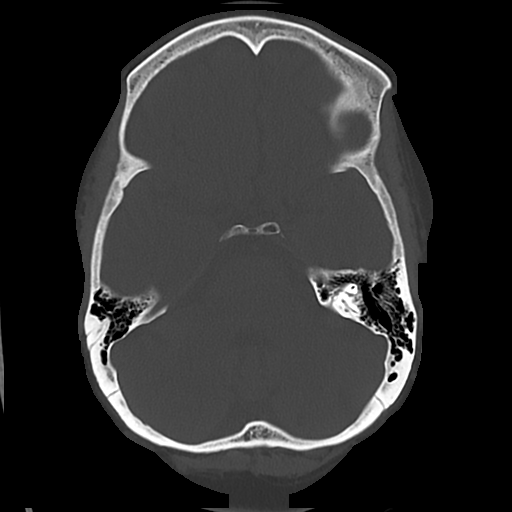
[im 22/72  bone]
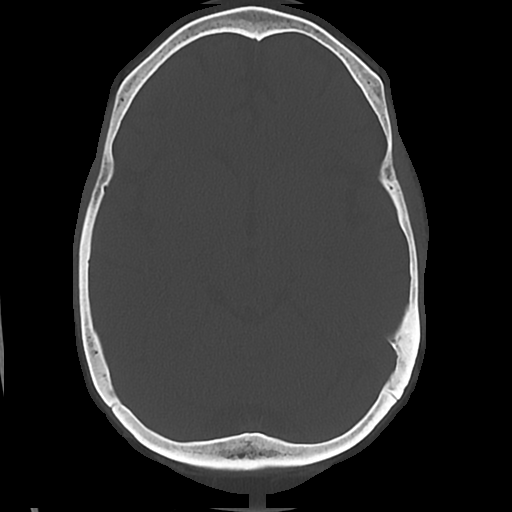

[Series 5: cor soft · coronal · 0.32mm/px · 3 of 67 slices shown]
[im 23/67  brain]
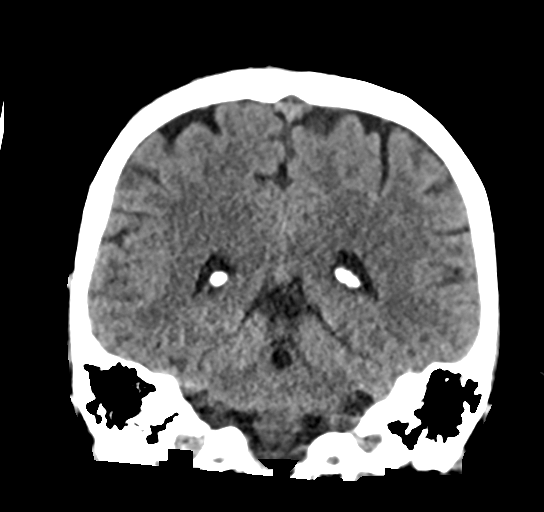
[im 30/67  brain]
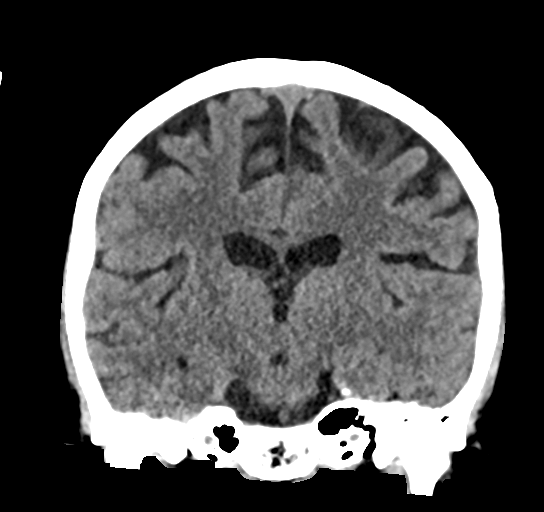
[im 37/67  brain]
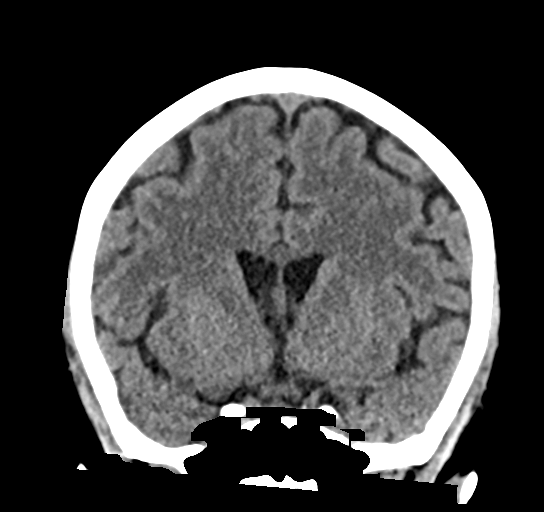

[Series 6: sag soft · sagittal · 0.32mm/px · 3 of 57 slices shown]
[im 19/57  brain]
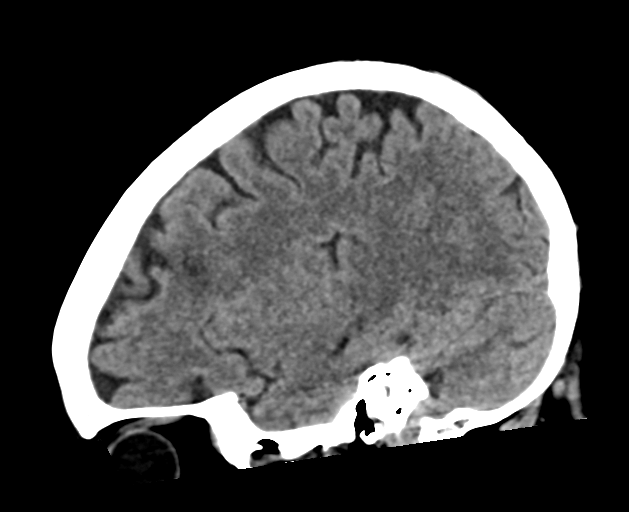
[im 29/57  brain]
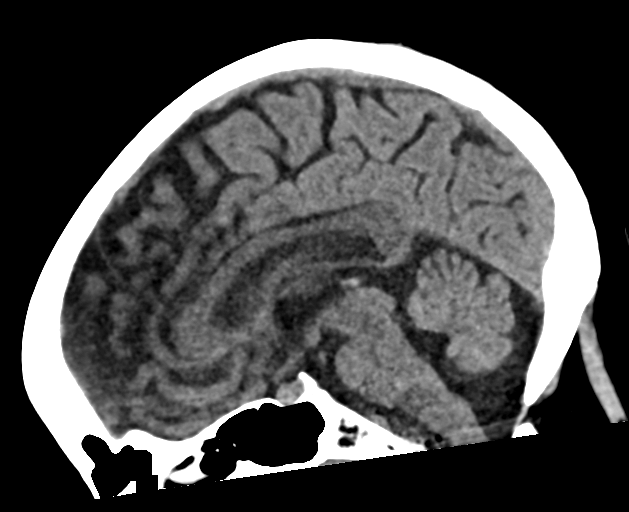
[im 38/57  brain]
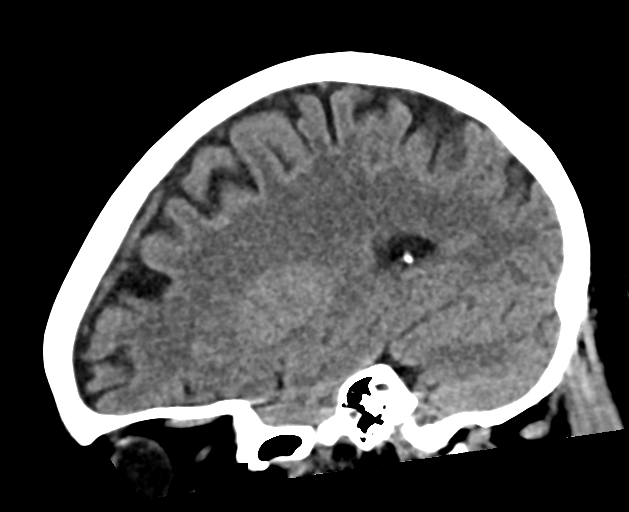

[16 of 47 positions shown; findings below may reference images not displayed]

FINDINGS: Brain: There is no acute intracranial hemorrhage or mass effect.
There is an area of hypoattenuation with loss of gray-white
differentiation involving right middle and inferior frontal gyri.
There is no extra-axial fluid collection. Mild prominence of the
ventricles and sulci for age likely reflecting parenchymal volume
loss.

Vascular: No hyperdense vessel or unexpected calcification.

Skull: Calvarium is unremarkable.

Sinuses/Orbits: No acute finding.

Other: None.
IMPRESSION: Small area of abnormal density in the right frontal lobe suspicious
for acute or subacute infarction. MRI recommended for further
evaluation.

Prominence of the ventricles and sulci greater than expected for
age.

These results were called by telephone at the time of interpretation
on [DATE] at [DATE] to provider BARTEL , who verbally
acknowledged these results.

## 2020-02-06 IMAGING — CT CT CERVICAL SPINE W/O CM
3 of 4 series · 12 of 33 positions shown, 14 images · non-contrast
Comparison: None.

CLINICAL DATA: Fall

EXAM:
CT CERVICAL SPINE WITHOUT CONTRAST
TECHNIQUE: Multidetector CT imaging of the cervical spine was performed without
intravenous contrast. Multiplanar CT image reconstructions were also
generated.

[Series 8: sag bone · sagittal · 0.22mm/px · 5 of 60 slices shown, 6 images]
[im 20/60  bone]
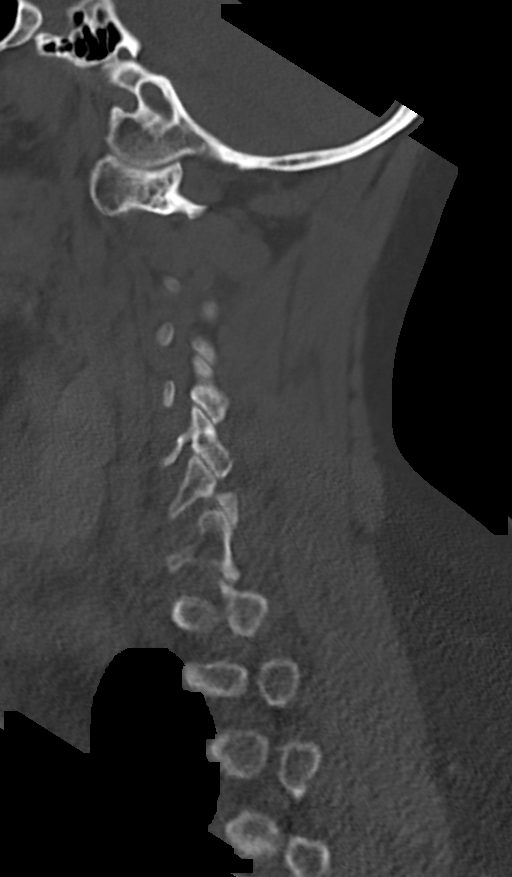
[im 25/60  bone]
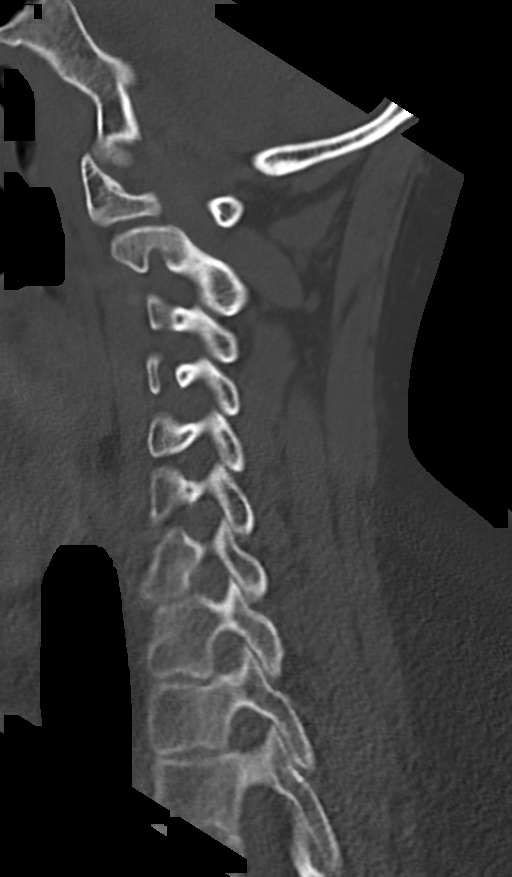
[im 30/60  soft-tissue]
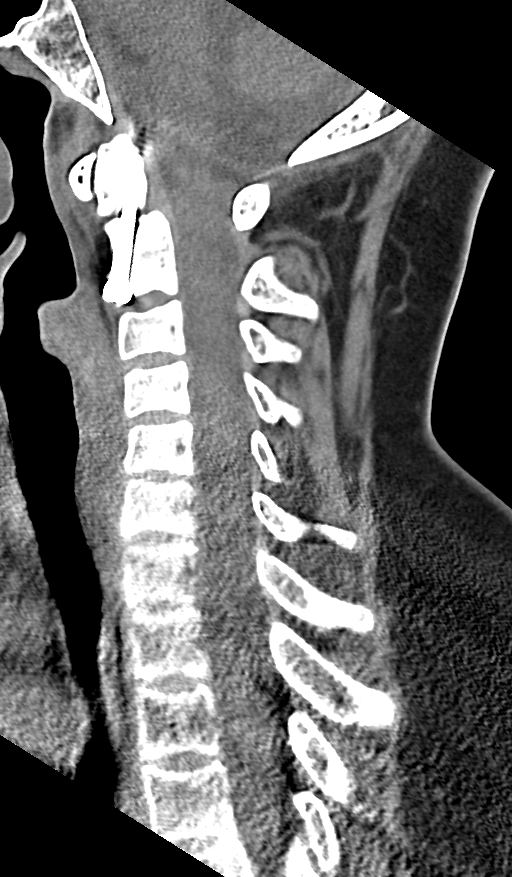
[im 30/60  bone]
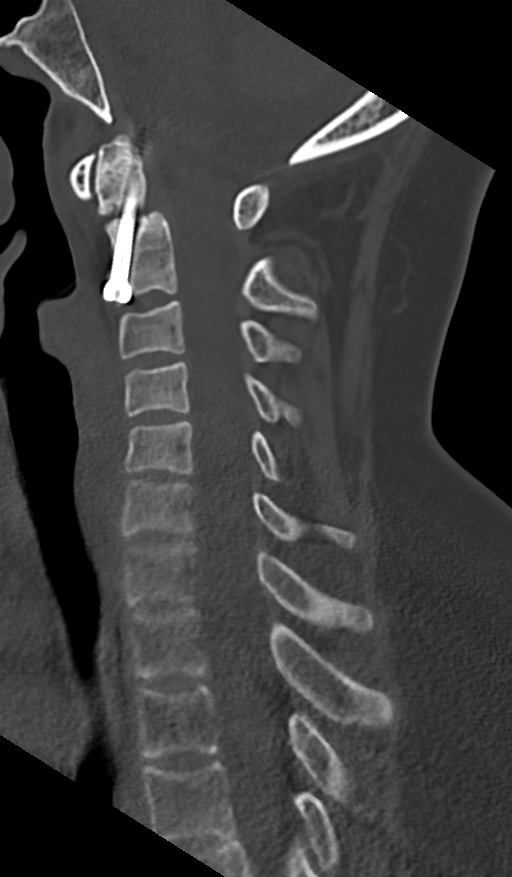
[im 35/60  bone]
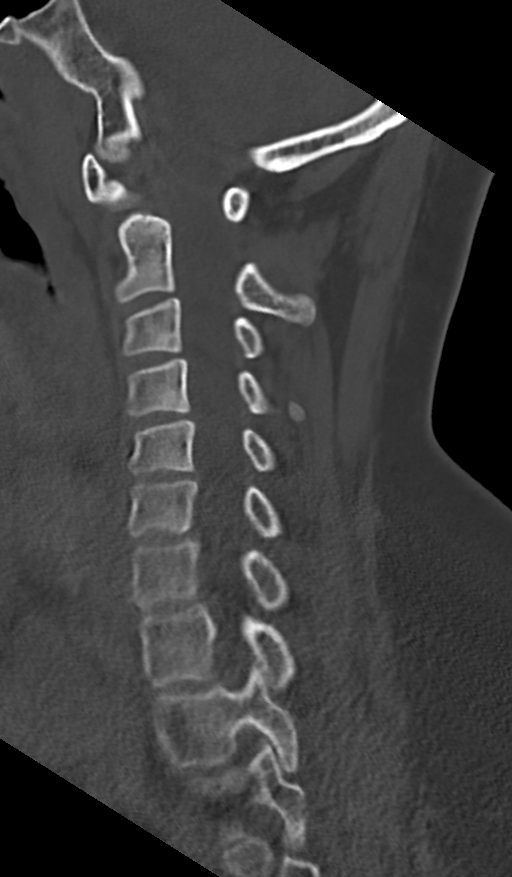
[im 40/60  bone]
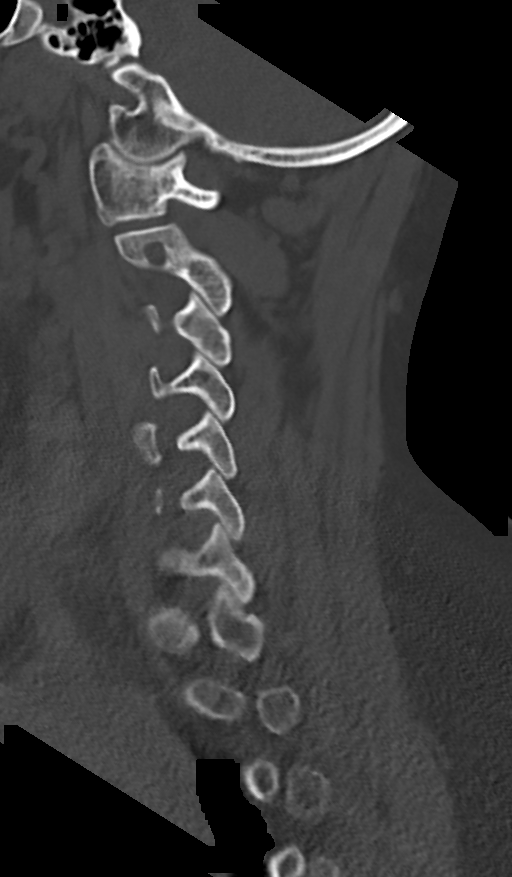

[Series 9: cor bone · coronal · 0.29mm/px · 3 of 56 slices shown]
[im 15/56  bone]
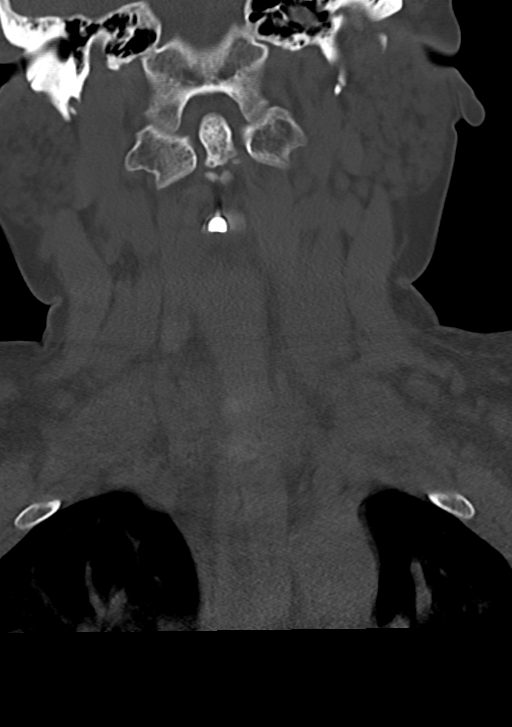
[im 24/56  bone]
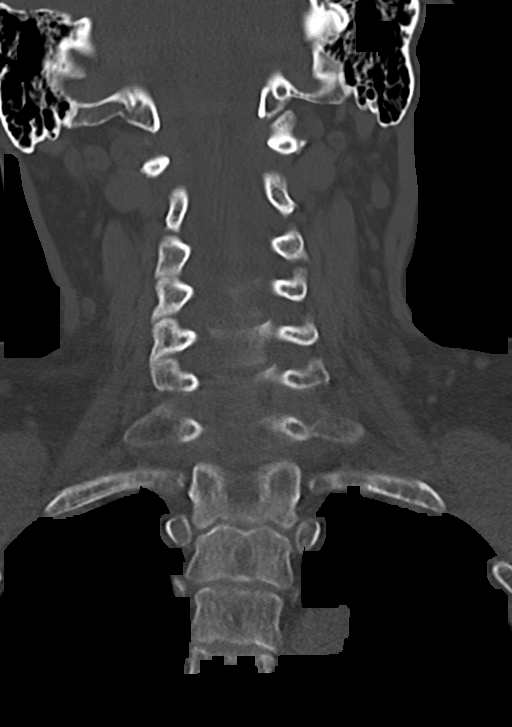
[im 32/56  bone]
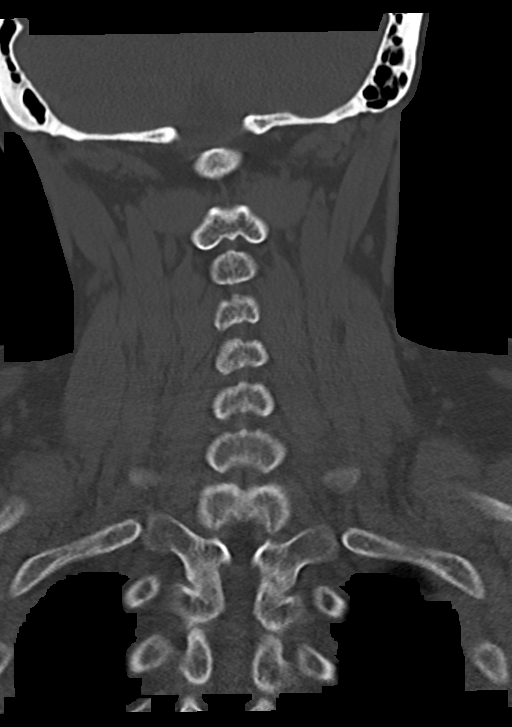

[Series 10: orthogonal axials · oblique · 0.21mm/px · 4 of 92 slices shown, 5 images]
[im 16/92  soft-tissue]
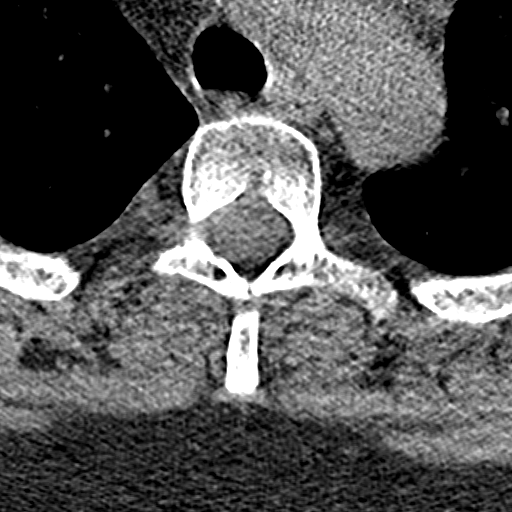
[im 16/92  bone]
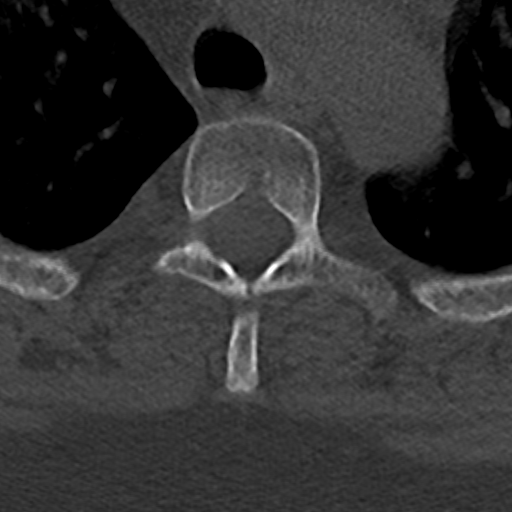
[im 31/92  bone]
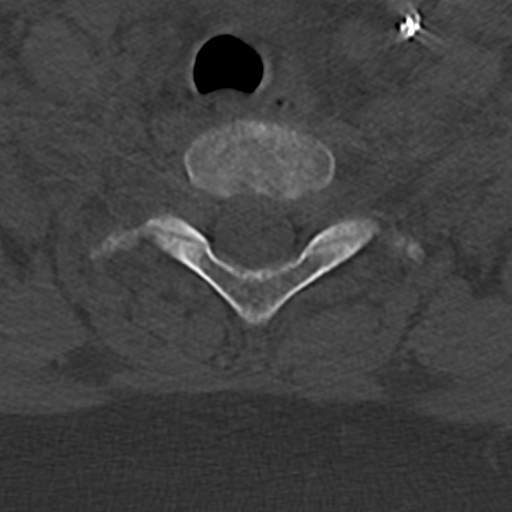
[im 61/92  bone]
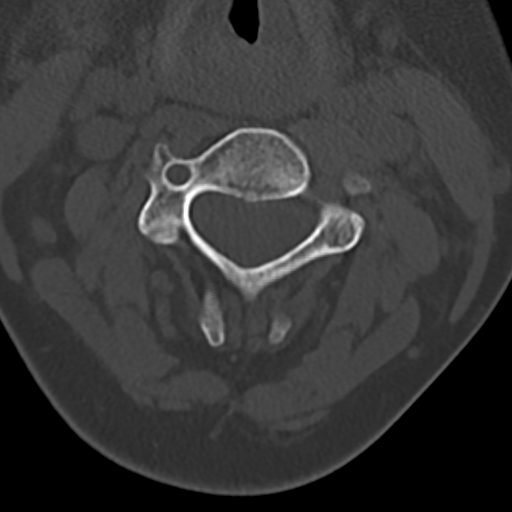
[im 76/92  bone]
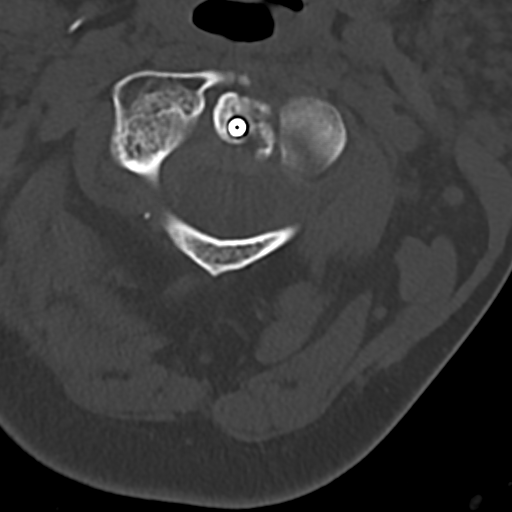

[12 of 33 positions shown; findings below may reference images not displayed]

FINDINGS: Alignment: Anteroposterior alignment is maintained.

Skull base and vertebrae: No acute fracture. There is a screw
traversing the C2 vertebral body and a nonunited dens fracture.
Cervical vertebral body heights are maintained. Mild, likely chronic
chronic loss of height at superior endplates of upper thoracic
vertebral bodies.

Soft tissues and spinal canal: No prevertebral fluid or swelling. No
visible canal hematoma.

Disc levels:  Intervertebral disc heights are preserved.

Upper chest: No apical lung mass.

Other: Heterogeneous parotid glands may reflect reported underlying
autoimmune disease.
IMPRESSION: No acute cervical spine fracture.

## 2020-02-06 IMAGING — MR MR MRA HEAD W/O CM
2 of 3 series · 23 of 48 positions shown · IV contrast (gadavist)
Comparison: Prior CT from earlier the same day.
COMPARISON: Prior CT from earlier the same day.

Addendum:
CLINICAL DATA: Initial evaluation for neuro deficit, stroke
suspected.

EXAM:
MRI HEAD WITHOUT CONTRAST
MRA HEAD WITHOUT CONTRAST
MRA NECK WITHOUT AND WITH CONTRAST
TECHNIQUE: Multiplanar, multiecho pulse sequences of the brain and surrounding
structures were obtained without intravenous contrast. Angiographic
images of the Circle of Willis were obtained using MRA technique
without intravenous contrast. Angiographic images of the neck were
obtained using MRA technique without and with intravenous contrast.
Carotid stenosis measurements (when applicable) are obtained
utilizing NASCET criteria, using the distal internal carotid
diameter as the denominator.
CONTRAST:  6mL GADAVIST GADOBUTROL 1 MMOL/ML IV SOLN

[Series 5: DWI · axial · 3.0mm · 0.88mm/px · z∈[-114,+37]mm · 15 of 104 slices shown (1 of 2)]
[im 1/104]
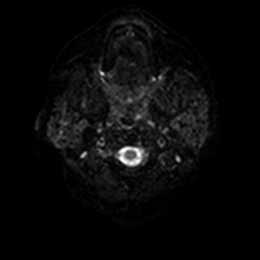
[im 8/104]
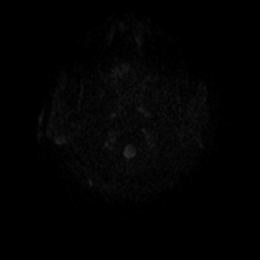
[im 15/104]
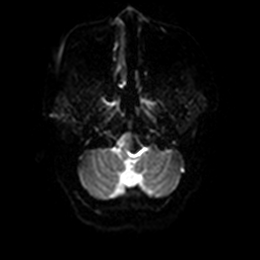
[im 23/104]
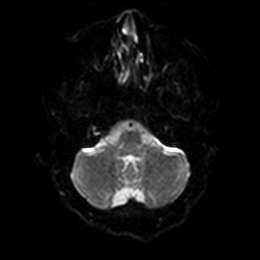
[im 30/104]
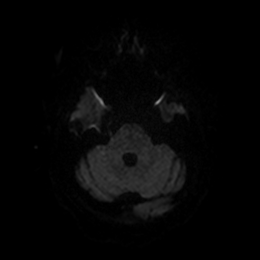
[im 37/104]
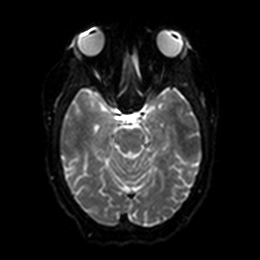
[im 45/104]
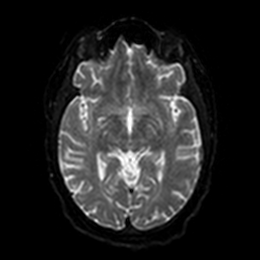
[im 52/104]
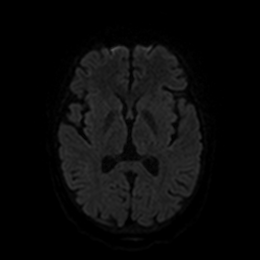
[im 59/104]
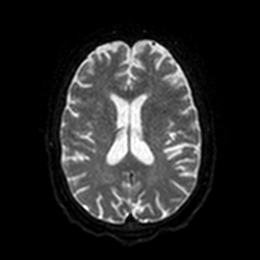
[im 67/104]
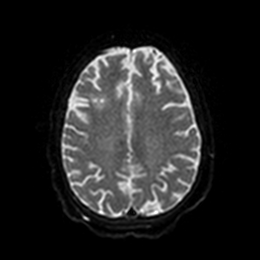
[im 74/104]
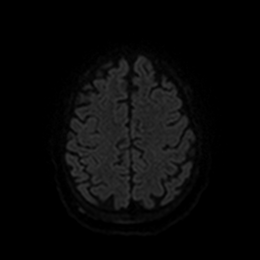
[im 81/104]
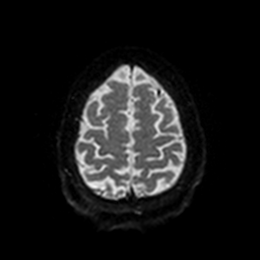
[im 89/104]
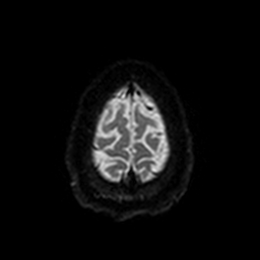
[im 96/104]
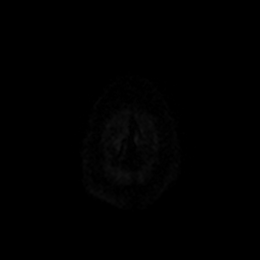
[im 104/104]
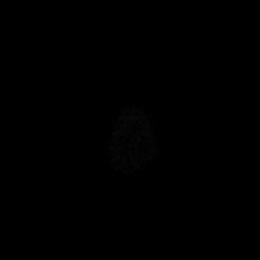

[Series 6: DWI · axial · 3.0mm · 0.88mm/px · z∈[-114,+37]mm · 8 of 52 slices shown (2 of 2)]
[im 1/52]
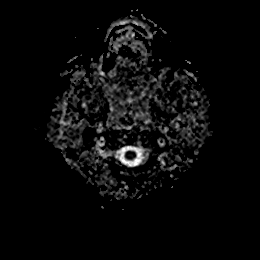
[im 8/52]
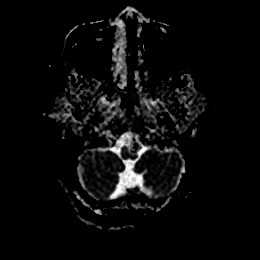
[im 15/52]
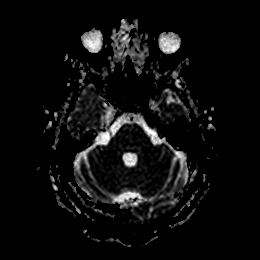
[im 22/52]
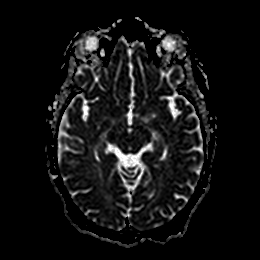
[im 30/52]
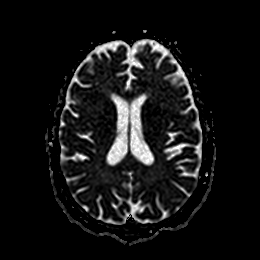
[im 37/52]
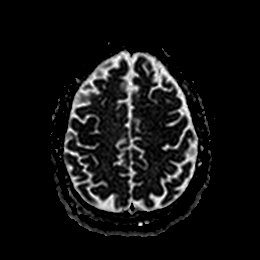
[im 44/52]
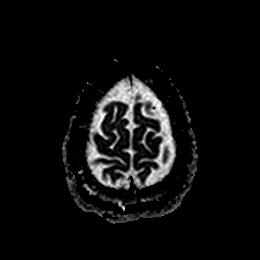
[im 52/52]
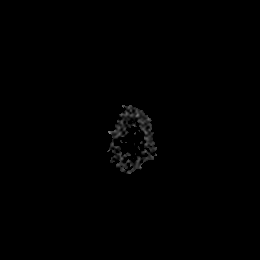

[23 of 48 positions shown; findings below may reference images not displayed]

FINDINGS: MRI HEAD FINDINGS

Brain: Cerebral volume within normal limits for patient age. Focal
linear area of T2/FLAIR signal abnormality seen radiating from the
right frontal periventricular white matter to the overlying right
frontal cortex, corresponding with abnormality seen on prior CT
(series 7, images 15-18). Finding most characteristic of chronic
encephalomalacia and gliosis. Small amount of chronic hemosiderin
staining noted as well. Finding felt to be most consistent with a
chronic ischemic infarct. Minimal T2/FLAIR hyperintensity noted
within the periventricular white matter, nonspecific, but suspected
to be related to underlying chronic microvascular ischemic disease,
mildly advanced for age. Additionally, there is a suspected subtle
punctate remote left thalamic lacunar infarct (series 14, image 13).

No abnormal foci of restricted diffusion to suggest acute or
subacute ischemia. Gray-white matter differentiation otherwise well
maintained. No other areas of chronic infarction. No other foci of
susceptibility artifact to suggest acute or chronic intracranial
hemorrhage.

No mass lesion, midline shift or mass effect. No hydrocephalus. No
extra-axial fluid collection. Major dural sinuses are grossly
patent.

Pituitary gland and suprasellar region are normal. Midline
structures intact and normal.

No abnormal enhancement seen within the brain itself. Subtle
asymmetric smooth dural thickening and enhancement noted overlying
the left cerebral hemisphere, of uncertain etiology or significance,
and may be post interventional in nature.

Vascular: Major intracranial vascular flow voids well maintained and
normal in appearance.

Skull and upper cervical spine: Craniocervical junction normal.
Visualized upper cervical spine within normal limits. Bone marrow
signal intensity normal. No scalp soft tissue abnormality.

Sinuses/Orbits: Globes and orbital soft tissues within normal
limits.

Mild scattered mucosal thickening seen about the ethmoidal air
cells. Paranasal sinuses are otherwise clear. No mastoid effusion.
Inner ear structures grossly normal.

Other: None.

MRA HEAD FINDINGS

ANTERIOR CIRCULATION:

Examination somewhat degraded by motion artifact.

Distal cervical segments of the internal carotid arteries are patent
with symmetric antegrade flow. Petrous, cavernous, and supraclinoid
segments patent bilaterally without significant stenosis or other
abnormality. A1 segments patent bilaterally. Normal anterior
communicating artery complex. Anterior cerebral arteries patent to
their distal aspects without stenosis. No M1 stenosis or occlusion.
Normal MCA bifurcations. Distal MCA branches well perfused and
symmetric.

POSTERIOR CIRCULATION:

Vertebral arteries largely codominant and patent to the
vertebrobasilar junction. Both PICAs patent. Basilar patent to its
distal aspect without appreciable stenosis. Superior cerebral
arteries patent bilaterally. Both PCAs supplied via the basilar as
well as prominent bilateral posterior communicating arteries. PCAs
well perfused to their distal aspects.

No intracranial aneurysm or other vascular abnormality.

MRA NECK FINDINGS

AORTIC ARCH: Examination mildly limited by lack of IV contrast.

Visualized aortic arch normal caliber with normal branch pattern. No
hemodynamically significant stenosis seen about the origin of the
great vessels.

RIGHT CAROTID SYSTEM: Right common and internal carotid arteries
widely patent without stenosis, occlusion, or evidence for
dissection. No significant atheromatous irregularity or narrowing
about the right carotid bifurcation.

LEFT CAROTID SYSTEM: Left common and internal carotid arteries
widely patent without stenosis, occlusion, or evidence for
dissection. No significant atheromatous irregularity or narrowing
about the left carotid bifurcation.

VERTEBRAL ARTERIES: Both vertebral arteries arise from the
subclavian arteries. No visible proximal subclavian artery stenosis.
Vertebral arteries patent within the neck without stenosis,
occlusion, or evidence for dissection.

There is an apparent 2.5 cm lesion within the left breast (series
10, image 63), indeterminate.
IMPRESSION: MRI HEAD IMPRESSION:

1. No acute intracranial abnormality.
2. Focal area of encephalomalacia and gliosis involving the cortical
and subcortical right frontal lobe, corresponding with abnormality
on prior CT. Appearance is most consistent with a chronic ischemic
infarct, right MCA distribution.
3. Additional subtle punctate remote lacunar infarct involving the
left thalamus.
4. Underlying mild chronic small vessel ischemic disease, advanced
for age.
5. Subtle asymmetric dural thickening and enhancement overlying the
left cerebral hemisphere, of uncertain significance or etiology, and
could be related to prior intervention. Correlation with LP and CSF
thigh use could be performed for further evaluation as clinically
warranted.

MRA HEAD IMPRESSION:

Normal intracranial MRA. No large vessel occlusion, hemodynamically
significant stenosis, or other acute vascular abnormality. No
aneurysm.

MRA NECK IMPRESSION:

1. Normal MRA of the neck. No hemodynamically significant stenosis
or other acute vascular abnormality.
2. Apparent 2.5 cm lesion within the left breast tissue,
indeterminate. Correlation with physical exam and dedicated breast
ultrasound recommended for further evaluation.

ADDENDUM:
In addition to the initially described findings, note is made of a
chronic odontoid fracture with lag fixation screw in place.
Additionally, the pituitary gland is mildly prominent with convex
border superiorly, but no discrete pituitary mass or other lesion.

*** End of Addendum ***
FINDINGS: MRI HEAD FINDINGS

Brain: Cerebral volume within normal limits for patient age. Focal
linear area of T2/FLAIR signal abnormality seen radiating from the
right frontal periventricular white matter to the overlying right
frontal cortex, corresponding with abnormality seen on prior CT
(series 7, images 15-18). Finding most characteristic of chronic
encephalomalacia and gliosis. Small amount of chronic hemosiderin
staining noted as well. Finding felt to be most consistent with a
chronic ischemic infarct. Minimal T2/FLAIR hyperintensity noted
within the periventricular white matter, nonspecific, but suspected
to be related to underlying chronic microvascular ischemic disease,
mildly advanced for age. Additionally, there is a suspected subtle
punctate remote left thalamic lacunar infarct (series 14, image 13).

No abnormal foci of restricted diffusion to suggest acute or
subacute ischemia. Gray-white matter differentiation otherwise well
maintained. No other areas of chronic infarction. No other foci of
susceptibility artifact to suggest acute or chronic intracranial
hemorrhage.

No mass lesion, midline shift or mass effect. No hydrocephalus. No
extra-axial fluid collection. Major dural sinuses are grossly
patent.

Pituitary gland and suprasellar region are normal. Midline
structures intact and normal.

No abnormal enhancement seen within the brain itself. Subtle
asymmetric smooth dural thickening and enhancement noted overlying
the left cerebral hemisphere, of uncertain etiology or significance,
and may be post interventional in nature.

Vascular: Major intracranial vascular flow voids well maintained and
normal in appearance.

Skull and upper cervical spine: Craniocervical junction normal.
Visualized upper cervical spine within normal limits. Bone marrow
signal intensity normal. No scalp soft tissue abnormality.

Sinuses/Orbits: Globes and orbital soft tissues within normal
limits.

Mild scattered mucosal thickening seen about the ethmoidal air
cells. Paranasal sinuses are otherwise clear. No mastoid effusion.
Inner ear structures grossly normal.

Other: None.

MRA HEAD FINDINGS

ANTERIOR CIRCULATION:

Examination somewhat degraded by motion artifact.

Distal cervical segments of the internal carotid arteries are patent
with symmetric antegrade flow. Petrous, cavernous, and supraclinoid
segments patent bilaterally without significant stenosis or other
abnormality. A1 segments patent bilaterally. Normal anterior
communicating artery complex. Anterior cerebral arteries patent to
their distal aspects without stenosis. No M1 stenosis or occlusion.
Normal MCA bifurcations. Distal MCA branches well perfused and
symmetric.

POSTERIOR CIRCULATION:

Vertebral arteries largely codominant and patent to the
vertebrobasilar junction. Both PICAs patent. Basilar patent to its
distal aspect without appreciable stenosis. Superior cerebral
arteries patent bilaterally. Both PCAs supplied via the basilar as
well as prominent bilateral posterior communicating arteries. PCAs
well perfused to their distal aspects.

No intracranial aneurysm or other vascular abnormality.

MRA NECK FINDINGS

AORTIC ARCH: Examination mildly limited by lack of IV contrast.

Visualized aortic arch normal caliber with normal branch pattern. No
hemodynamically significant stenosis seen about the origin of the
great vessels.

RIGHT CAROTID SYSTEM: Right common and internal carotid arteries
widely patent without stenosis, occlusion, or evidence for
dissection. No significant atheromatous irregularity or narrowing
about the right carotid bifurcation.

LEFT CAROTID SYSTEM: Left common and internal carotid arteries
widely patent without stenosis, occlusion, or evidence for
dissection. No significant atheromatous irregularity or narrowing
about the left carotid bifurcation.

VERTEBRAL ARTERIES: Both vertebral arteries arise from the
subclavian arteries. No visible proximal subclavian artery stenosis.
Vertebral arteries patent within the neck without stenosis,
occlusion, or evidence for dissection.

There is an apparent 2.5 cm lesion within the left breast (series
10, image 63), indeterminate.
IMPRESSION: MRI HEAD IMPRESSION:

1. No acute intracranial abnormality.
2. Focal area of encephalomalacia and gliosis involving the cortical
and subcortical right frontal lobe, corresponding with abnormality
on prior CT. Appearance is most consistent with a chronic ischemic
infarct, right MCA distribution.
3. Additional subtle punctate remote lacunar infarct involving the
left thalamus.
4. Underlying mild chronic small vessel ischemic disease, advanced
for age.
5. Subtle asymmetric dural thickening and enhancement overlying the
left cerebral hemisphere, of uncertain significance or etiology, and
could be related to prior intervention. Correlation with LP and CSF
thigh use could be performed for further evaluation as clinically
warranted.

MRA HEAD IMPRESSION:

Normal intracranial MRA. No large vessel occlusion, hemodynamically
significant stenosis, or other acute vascular abnormality. No
aneurysm.

MRA NECK IMPRESSION:

1. Normal MRA of the neck. No hemodynamically significant stenosis
or other acute vascular abnormality.
2. Apparent 2.5 cm lesion within the left breast tissue,
indeterminate. Correlation with physical exam and dedicated breast
ultrasound recommended for further evaluation.

## 2020-02-06 IMAGING — MR MR MRA NECK W/O CM
2 of 6 series · 18 of 48 positions shown · IV contrast (gadavist)
Comparison: Prior CT from earlier the same day.
COMPARISON: Prior CT from earlier the same day.

Addendum:
CLINICAL DATA: Initial evaluation for neuro deficit, stroke
suspected.

EXAM:
MRI HEAD WITHOUT CONTRAST
MRA HEAD WITHOUT CONTRAST
MRA NECK WITHOUT AND WITH CONTRAST
TECHNIQUE: Multiplanar, multiecho pulse sequences of the brain and surrounding
structures were obtained without intravenous contrast. Angiographic
images of the Circle of Willis were obtained using MRA technique
without intravenous contrast. Angiographic images of the neck were
obtained using MRA technique without and with intravenous contrast.
Carotid stenosis measurements (when applicable) are obtained
utilizing NASCET criteria, using the distal internal carotid
diameter as the denominator.
CONTRAST:  6mL GADAVIST GADOBUTROL 1 MMOL/ML IV SOLN

[Series 10: [id number]_iso · axial · 0.6mm · 0.52mm/px · z∈[-259,-112]mm · 16 of 266 slices shown]
[im 1/266]
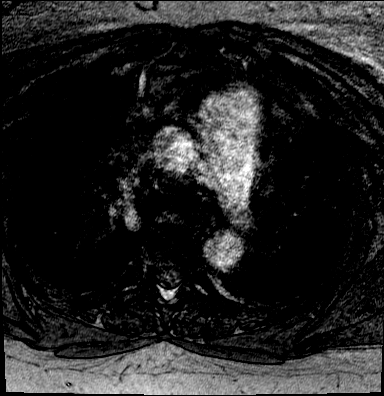
[im 7/266]
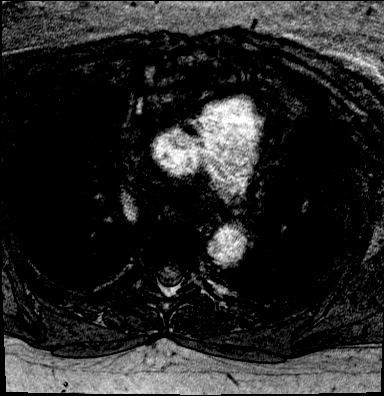
[im 13/266]
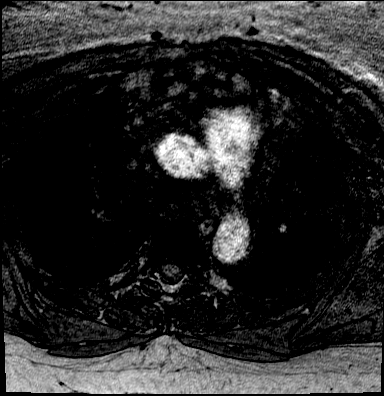
[im 20/266]
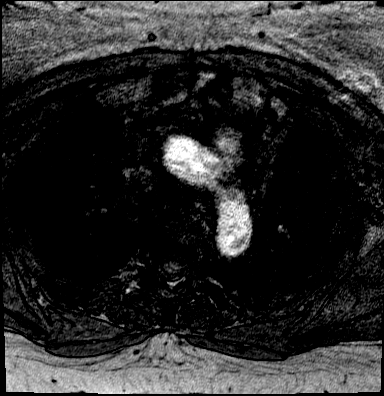
[im 26/266]
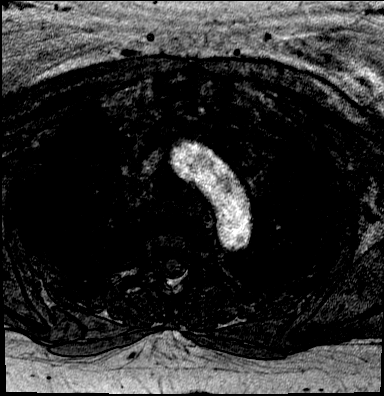
[im 33/266]
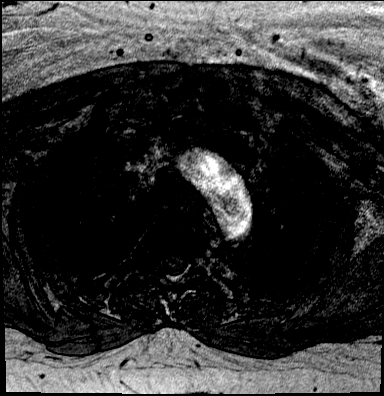
[im 39/266]
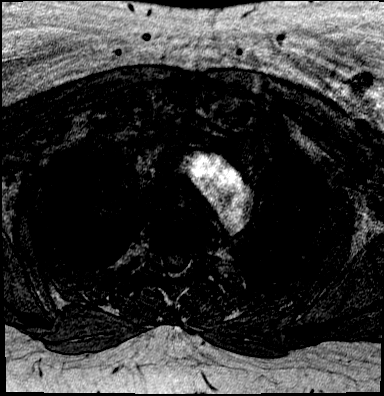
[im 46/266]
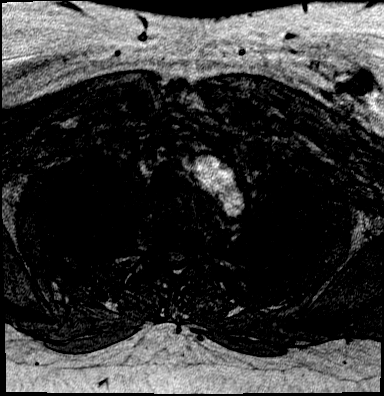
[im 85/266]
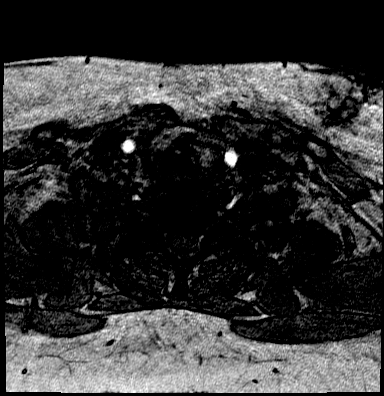
[im 117/266]
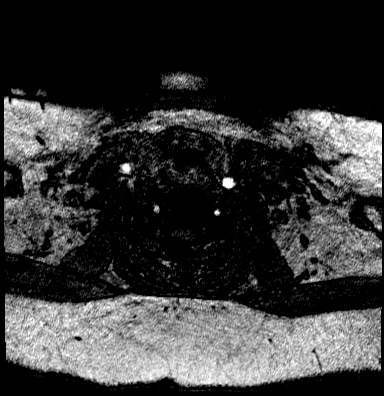
[im 136/266]
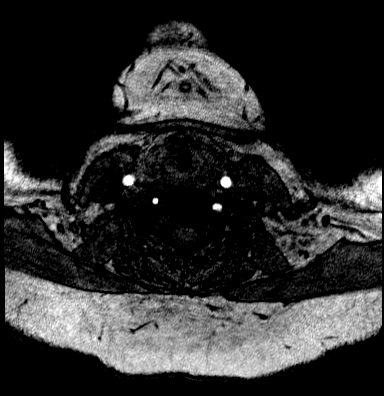
[im 149/266]
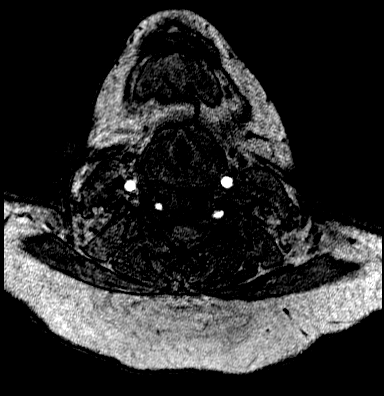
[im 181/266]
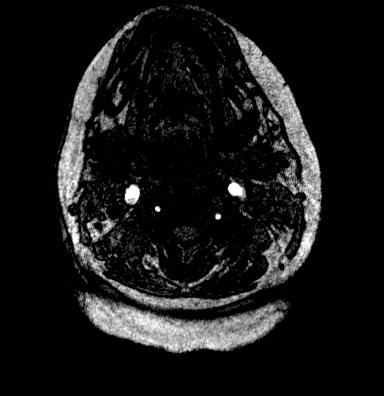
[im 220/266]
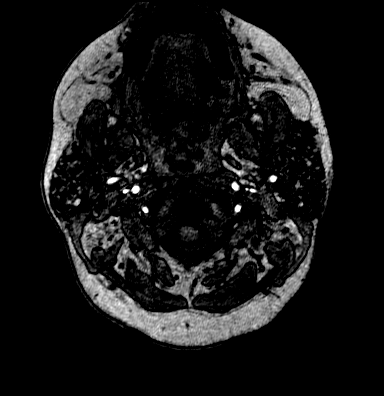
[im 227/266]
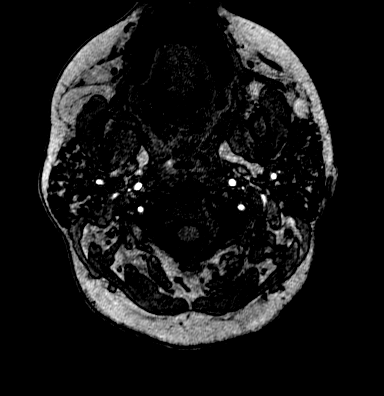
[im 253/266]
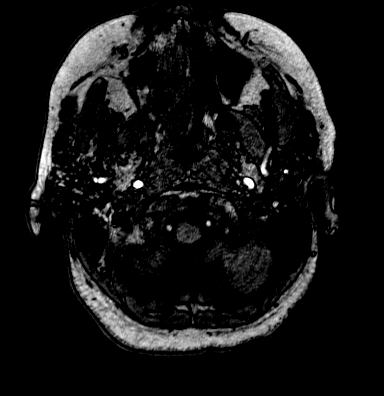

[Series 13: [id number]_blood-suppr. · axial · 2.0mm · 0.47mm/px · z∈[-148,-128]mm · 2 of 10 slices shown]
[im 1/10]
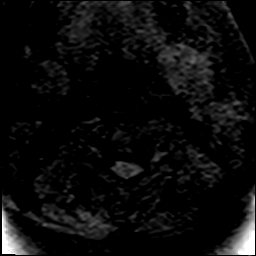
[im 10/10]
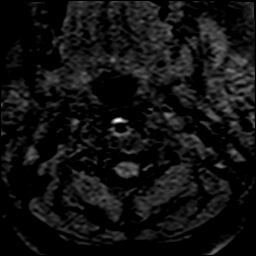

[18 of 48 positions shown; findings below may reference images not displayed]

FINDINGS: MRI HEAD FINDINGS

Brain: Cerebral volume within normal limits for patient age. Focal
linear area of T2/FLAIR signal abnormality seen radiating from the
right frontal periventricular white matter to the overlying right
frontal cortex, corresponding with abnormality seen on prior CT
(series 7, images 15-18). Finding most characteristic of chronic
encephalomalacia and gliosis. Small amount of chronic hemosiderin
staining noted as well. Finding felt to be most consistent with a
chronic ischemic infarct. Minimal T2/FLAIR hyperintensity noted
within the periventricular white matter, nonspecific, but suspected
to be related to underlying chronic microvascular ischemic disease,
mildly advanced for age. Additionally, there is a suspected subtle
punctate remote left thalamic lacunar infarct (series 14, image 13).

No abnormal foci of restricted diffusion to suggest acute or
subacute ischemia. Gray-white matter differentiation otherwise well
maintained. No other areas of chronic infarction. No other foci of
susceptibility artifact to suggest acute or chronic intracranial
hemorrhage.

No mass lesion, midline shift or mass effect. No hydrocephalus. No
extra-axial fluid collection. Major dural sinuses are grossly
patent.

Pituitary gland and suprasellar region are normal. Midline
structures intact and normal.

No abnormal enhancement seen within the brain itself. Subtle
asymmetric smooth dural thickening and enhancement noted overlying
the left cerebral hemisphere, of uncertain etiology or significance,
and may be post interventional in nature.

Vascular: Major intracranial vascular flow voids well maintained and
normal in appearance.

Skull and upper cervical spine: Craniocervical junction normal.
Visualized upper cervical spine within normal limits. Bone marrow
signal intensity normal. No scalp soft tissue abnormality.

Sinuses/Orbits: Globes and orbital soft tissues within normal
limits.

Mild scattered mucosal thickening seen about the ethmoidal air
cells. Paranasal sinuses are otherwise clear. No mastoid effusion.
Inner ear structures grossly normal.

Other: None.

MRA HEAD FINDINGS

ANTERIOR CIRCULATION:

Examination somewhat degraded by motion artifact.

Distal cervical segments of the internal carotid arteries are patent
with symmetric antegrade flow. Petrous, cavernous, and supraclinoid
segments patent bilaterally without significant stenosis or other
abnormality. A1 segments patent bilaterally. Normal anterior
communicating artery complex. Anterior cerebral arteries patent to
their distal aspects without stenosis. No M1 stenosis or occlusion.
Normal MCA bifurcations. Distal MCA branches well perfused and
symmetric.

POSTERIOR CIRCULATION:

Vertebral arteries largely codominant and patent to the
vertebrobasilar junction. Both PICAs patent. Basilar patent to its
distal aspect without appreciable stenosis. Superior cerebral
arteries patent bilaterally. Both PCAs supplied via the basilar as
well as prominent bilateral posterior communicating arteries. PCAs
well perfused to their distal aspects.

No intracranial aneurysm or other vascular abnormality.

MRA NECK FINDINGS

AORTIC ARCH: Examination mildly limited by lack of IV contrast.

Visualized aortic arch normal caliber with normal branch pattern. No
hemodynamically significant stenosis seen about the origin of the
great vessels.

RIGHT CAROTID SYSTEM: Right common and internal carotid arteries
widely patent without stenosis, occlusion, or evidence for
dissection. No significant atheromatous irregularity or narrowing
about the right carotid bifurcation.

LEFT CAROTID SYSTEM: Left common and internal carotid arteries
widely patent without stenosis, occlusion, or evidence for
dissection. No significant atheromatous irregularity or narrowing
about the left carotid bifurcation.

VERTEBRAL ARTERIES: Both vertebral arteries arise from the
subclavian arteries. No visible proximal subclavian artery stenosis.
Vertebral arteries patent within the neck without stenosis,
occlusion, or evidence for dissection.

There is an apparent 2.5 cm lesion within the left breast (series
10, image 63), indeterminate.
IMPRESSION: MRI HEAD IMPRESSION:

1. No acute intracranial abnormality.
2. Focal area of encephalomalacia and gliosis involving the cortical
and subcortical right frontal lobe, corresponding with abnormality
on prior CT. Appearance is most consistent with a chronic ischemic
infarct, right MCA distribution.
3. Additional subtle punctate remote lacunar infarct involving the
left thalamus.
4. Underlying mild chronic small vessel ischemic disease, advanced
for age.
5. Subtle asymmetric dural thickening and enhancement overlying the
left cerebral hemisphere, of uncertain significance or etiology, and
could be related to prior intervention. Correlation with LP and CSF
thigh use could be performed for further evaluation as clinically
warranted.

MRA HEAD IMPRESSION:

Normal intracranial MRA. No large vessel occlusion, hemodynamically
significant stenosis, or other acute vascular abnormality. No
aneurysm.

MRA NECK IMPRESSION:

1. Normal MRA of the neck. No hemodynamically significant stenosis
or other acute vascular abnormality.
2. Apparent 2.5 cm lesion within the left breast tissue,
indeterminate. Correlation with physical exam and dedicated breast
ultrasound recommended for further evaluation.

ADDENDUM:
In addition to the initially described findings, note is made of a
chronic odontoid fracture with lag fixation screw in place.
Additionally, the pituitary gland is mildly prominent with convex
border superiorly, but no discrete pituitary mass or other lesion.

*** End of Addendum ***
FINDINGS: MRI HEAD FINDINGS

Brain: Cerebral volume within normal limits for patient age. Focal
linear area of T2/FLAIR signal abnormality seen radiating from the
right frontal periventricular white matter to the overlying right
frontal cortex, corresponding with abnormality seen on prior CT
(series 7, images 15-18). Finding most characteristic of chronic
encephalomalacia and gliosis. Small amount of chronic hemosiderin
staining noted as well. Finding felt to be most consistent with a
chronic ischemic infarct. Minimal T2/FLAIR hyperintensity noted
within the periventricular white matter, nonspecific, but suspected
to be related to underlying chronic microvascular ischemic disease,
mildly advanced for age. Additionally, there is a suspected subtle
punctate remote left thalamic lacunar infarct (series 14, image 13).

No abnormal foci of restricted diffusion to suggest acute or
subacute ischemia. Gray-white matter differentiation otherwise well
maintained. No other areas of chronic infarction. No other foci of
susceptibility artifact to suggest acute or chronic intracranial
hemorrhage.

No mass lesion, midline shift or mass effect. No hydrocephalus. No
extra-axial fluid collection. Major dural sinuses are grossly
patent.

Pituitary gland and suprasellar region are normal. Midline
structures intact and normal.

No abnormal enhancement seen within the brain itself. Subtle
asymmetric smooth dural thickening and enhancement noted overlying
the left cerebral hemisphere, of uncertain etiology or significance,
and may be post interventional in nature.

Vascular: Major intracranial vascular flow voids well maintained and
normal in appearance.

Skull and upper cervical spine: Craniocervical junction normal.
Visualized upper cervical spine within normal limits. Bone marrow
signal intensity normal. No scalp soft tissue abnormality.

Sinuses/Orbits: Globes and orbital soft tissues within normal
limits.

Mild scattered mucosal thickening seen about the ethmoidal air
cells. Paranasal sinuses are otherwise clear. No mastoid effusion.
Inner ear structures grossly normal.

Other: None.

MRA HEAD FINDINGS

ANTERIOR CIRCULATION:

Examination somewhat degraded by motion artifact.

Distal cervical segments of the internal carotid arteries are patent
with symmetric antegrade flow. Petrous, cavernous, and supraclinoid
segments patent bilaterally without significant stenosis or other
abnormality. A1 segments patent bilaterally. Normal anterior
communicating artery complex. Anterior cerebral arteries patent to
their distal aspects without stenosis. No M1 stenosis or occlusion.
Normal MCA bifurcations. Distal MCA branches well perfused and
symmetric.

POSTERIOR CIRCULATION:

Vertebral arteries largely codominant and patent to the
vertebrobasilar junction. Both PICAs patent. Basilar patent to its
distal aspect without appreciable stenosis. Superior cerebral
arteries patent bilaterally. Both PCAs supplied via the basilar as
well as prominent bilateral posterior communicating arteries. PCAs
well perfused to their distal aspects.

No intracranial aneurysm or other vascular abnormality.

MRA NECK FINDINGS

AORTIC ARCH: Examination mildly limited by lack of IV contrast.

Visualized aortic arch normal caliber with normal branch pattern. No
hemodynamically significant stenosis seen about the origin of the
great vessels.

RIGHT CAROTID SYSTEM: Right common and internal carotid arteries
widely patent without stenosis, occlusion, or evidence for
dissection. No significant atheromatous irregularity or narrowing
about the right carotid bifurcation.

LEFT CAROTID SYSTEM: Left common and internal carotid arteries
widely patent without stenosis, occlusion, or evidence for
dissection. No significant atheromatous irregularity or narrowing
about the left carotid bifurcation.

VERTEBRAL ARTERIES: Both vertebral arteries arise from the
subclavian arteries. No visible proximal subclavian artery stenosis.
Vertebral arteries patent within the neck without stenosis,
occlusion, or evidence for dissection.

There is an apparent 2.5 cm lesion within the left breast (series
10, image 63), indeterminate.
IMPRESSION: MRI HEAD IMPRESSION:

1. No acute intracranial abnormality.
2. Focal area of encephalomalacia and gliosis involving the cortical
and subcortical right frontal lobe, corresponding with abnormality
on prior CT. Appearance is most consistent with a chronic ischemic
infarct, right MCA distribution.
3. Additional subtle punctate remote lacunar infarct involving the
left thalamus.
4. Underlying mild chronic small vessel ischemic disease, advanced
for age.
5. Subtle asymmetric dural thickening and enhancement overlying the
left cerebral hemisphere, of uncertain significance or etiology, and
could be related to prior intervention. Correlation with LP and CSF
thigh use could be performed for further evaluation as clinically
warranted.

MRA HEAD IMPRESSION:

Normal intracranial MRA. No large vessel occlusion, hemodynamically
significant stenosis, or other acute vascular abnormality. No
aneurysm.

MRA NECK IMPRESSION:

1. Normal MRA of the neck. No hemodynamically significant stenosis
or other acute vascular abnormality.
2. Apparent 2.5 cm lesion within the left breast tissue,
indeterminate. Correlation with physical exam and dedicated breast
ultrasound recommended for further evaluation.

## 2020-02-06 MED ORDER — PROMETHAZINE HCL 25 MG/ML IJ SOLN
12.5000 mg | Freq: Once | INTRAMUSCULAR | Status: AC
  Administered 2020-02-06: 12.5 mg via INTRAVENOUS
  Filled 2020-02-06: qty 1

## 2020-02-06 MED ORDER — SODIUM CHLORIDE 0.9 % IV BOLUS
2000.0000 mL | Freq: Once | INTRAVENOUS | Status: AC
  Administered 2020-02-06: 2000 mL via INTRAVENOUS

## 2020-02-06 MED ORDER — SODIUM CHLORIDE 0.9 % IV BOLUS
500.0000 mL | Freq: Once | INTRAVENOUS | Status: AC
  Administered 2020-02-06: 500 mL via INTRAVENOUS

## 2020-02-06 MED ORDER — GADOBUTROL 1 MMOL/ML IV SOLN
6.0000 mL | Freq: Once | INTRAVENOUS | Status: AC | PRN
  Administered 2020-02-06: 6 mL via INTRAVENOUS

## 2020-02-06 MED ORDER — HYDROMORPHONE HCL 1 MG/ML IJ SOLN
1.0000 mg | Freq: Once | INTRAMUSCULAR | Status: AC
  Administered 2020-02-06: 1 mg via INTRAVENOUS
  Filled 2020-02-06: qty 1

## 2020-02-06 MED ORDER — LEVETIRACETAM IN NACL 1000 MG/100ML IV SOLN: 1000.0000 mg | Freq: Once | INTRAVENOUS | Status: DC

## 2020-02-06 MED ORDER — ONDANSETRON HCL 4 MG/2ML IJ SOLN
4.0000 mg | Freq: Once | INTRAMUSCULAR | Status: AC
  Administered 2020-02-06: 4 mg via INTRAVENOUS
  Filled 2020-02-06: qty 2

## 2020-02-06 MED ORDER — FENTANYL CITRATE (PF) 100 MCG/2ML IJ SOLN
100.0000 ug | INTRAMUSCULAR | Status: AC | PRN
  Administered 2020-02-06 (×3): 100 ug via INTRAVENOUS
  Filled 2020-02-06 (×3): qty 2

## 2020-02-06 MED ORDER — LORAZEPAM 2 MG/ML IJ SOLN
1.0000 mg | Freq: Once | INTRAMUSCULAR | Status: AC
  Administered 2020-02-06: 1 mg via INTRAVENOUS
  Filled 2020-02-06: qty 1

## 2020-02-06 MED ORDER — PROMETHAZINE HCL 25 MG PO TABS: 25.0000 mg | ORAL_TABLET | Freq: Four times a day (QID) | ORAL | 0 refills | Status: AC | PRN

## 2020-02-06 NOTE — ED Provider Notes (Signed)
[facility] EMERGENCY DEPARTMENT Provider Note   CSN: [id number] Arrival date & time: 02/06/20  1614     History Chief Complaint  Patient presents with  . Seizures  . Lupus    Denise Browning is a 29 y.o. female.  HPI Patient seen by me at 4:42 PM.  At this time she is being cared for in examination room #7, by nursing staff.  Triage nurse with the patient states that she has been talking to the patient who told her that she had had several seizures and been vomiting.  She has been trying to take her Keppra.  As I entered the room the patient was not seizing, and not talking.  She did not respond to sternal rub.  At this time it was noted that her eyes had reactive pupils around 4 mm bilaterally.  Her tongue was noted to be abraded on the right anterior, but not actively bleeding.  The patient had normal tone, to the arms and legs bilaterally, but no muscular activity was seen, and she did not respond to command to move.  As I watched her, within 2 minutes she began to respond to nursing, purposefully, but clasping her hands on her abdomen as they moved her to an ED stretcher.  Patient then requested medications for her vomiting.  Level 5 caveat-altered mental status    Past Medical History:  Diagnosis Date  . Clotting disorder (HCC)   . Hypertension   . Lupus (HCC)   . Osteoporosis   . Seizures (HCC)     There are no problems to display for this patient.   Past Surgical History:  Procedure Laterality Date  . NECK SURGERY    . PEG TUBE PLACEMENT    . power port placement    . TOOTH EXTRACTION       OB History   No obstetric history on file.     No family history on file.  Social History   Tobacco Use  . Smoking status: Never Smoker  . Smokeless tobacco: Never Used  Vaping Use  . Vaping Use: Never used  Substance Use Topics  . Alcohol use: Yes    Comment: occasional  . Drug use: Never    Home Medications Prior to Admission  medications   Medication Sig Start Date End Date Taking? Authorizing Provider  amLODipine (NORVASC) 10 MG tablet Take 10 mg by mouth daily. 01/16/20  Yes [provider]  carvedilol (COREG) 25 MG tablet Take 25 mg by mouth 2 (two) times daily. 01/16/20  Yes [provider]  cloNIDine (CATAPRES) 0.2 MG tablet Take 0.2 mg by mouth 2 (two) times daily. 01/18/20  Yes [provider]  fondaparinux (ARIXTRA) 7.5 MG/0.6ML SOLN injection Inject 7.5 mg into the skin daily.   Yes [provider]  gabapentin (NEURONTIN) 100 MG capsule Take 100 mg by mouth 2 (two) times daily. 11/28/19  Yes [provider]  hydrocortisone cream 1 % Apply 1 application topically as needed for itching.   Yes [provider]  ondansetron (ZOFRAN-ODT) 8 MG disintegrating tablet Take 8 mg by mouth every 6 (six) hours as needed. 01/18/20  Yes [provider]  pantoprazole (PROTONIX) 40 MG tablet Take 40 mg by mouth 2 (two) times daily. 11/07/19  Yes [provider]  predniSONE (DELTASONE) 10 MG tablet Take 10 mg by mouth daily with breakfast.   Yes [provider]  Probiotic Product (PROBIOTIC-10 PO) Take 1 tablet by mouth daily.  Yes [provider]  telmisartan-hydrochlorothiazide (MICARDIS HCT) 80-12.5 MG tablet Take 1 tablet by mouth daily. 11/28/19  Yes [provider]  tiZANidine (ZANAFLEX) 4 MG tablet Take 4 mg by mouth every 8 (eight) hours as needed for muscle spasms.   Yes [provider]  venlafaxine XR (EFFEXOR-XR) 75 MG 24 hr capsule Take 75 mg by mouth daily. 12/05/19  Yes [provider]  VIMPAT 150 MG TABS Take 1 tablet by mouth 2 (two) times daily. 01/25/20  Yes [provider]  zolpidem (AMBIEN) 10 MG tablet Take 10 mg by mouth at bedtime as needed for sleep.   Yes [provider]  ALPRAZolam Prudy Feeler) 1 MG tablet Take 1 mg by mouth 2 (two) times daily. 01/30/20   [provider]    cetirizine (ZYRTEC) 10 MG tablet Take 10 mg by mouth as needed for allergies.    [provider]  hydroxychloroquine (PLAQUENIL) 200 MG tablet Take 200 mg by mouth 2 (two) times daily.    [provider]  levETIRAcetam (KEPPRA) 100 MG/ML solution Take 1,500 mg by mouth 2 (two) times daily.     [provider]  Multiple Vitamin (MULTIVITAMIN WITH MINERALS) TABS tablet Take 1 tablet by mouth daily.    [provider]  mycophenolate (CELLCEPT) 200 MG/ML suspension Take 500 mg by mouth 2 (two) times daily.     [provider]  oxyCODONE-acetaminophen (PERCOCET) 10-325 MG tablet Take 1 tablet by mouth every 6 (six) hours as needed for pain.    [provider]  promethazine (PHENERGAN) 25 MG tablet Take 1 tablet (25 mg total) by mouth every 6 (six) hours as needed for nausea or vomiting. 02/06/20   Mancel Bale, MD    Allergies    Belimumab, Morphine and related, Shellfish allergy, Sulfa antibiotics, and Toradol [ketorolac tromethamine]  Review of Systems   Review of Systems  Unable to perform ROS: Mental status change    Physical Exam Updated Vital Signs BP 108/70   Pulse (!) 117   Temp 98 F (36.7 C) (Oral)   Resp (!) 31   LMP 02/02/2020   SpO2 96%   Physical Exam Vitals and nursing note reviewed.  Constitutional:      Appearance: She is well-developed.  HENT:     Head: Normocephalic and atraumatic.     Right Ear: External ear normal.     Left Ear: External ear normal.     Mouth/Throat:     Comments: Right anterior tongue abrasion Eyes:     Conjunctiva/sclera: Conjunctivae normal.     Pupils: Pupils are equal, round, and reactive to light.  Neck:     Trachea: Phonation normal.  Cardiovascular:     Rate and Rhythm: Normal rate and regular rhythm.     Heart sounds: Normal heart sounds.  Pulmonary:     Effort: Pulmonary effort is normal.     Breath sounds: Normal breath sounds.  Abdominal:     Palpations: Abdomen is  soft.     Tenderness: There is no abdominal tenderness.  Musculoskeletal:        General: Normal range of motion.     Cervical back: Normal range of motion and neck supple.  Skin:    General: Skin is warm and dry.  Neurological:     Mental Status: She is alert and oriented to person, place, and time.     Cranial Nerves: No cranial nerve deficit.     Sensory: No sensory deficit.  Motor: No abnormal muscle tone.     Coordination: Coordination normal.  Psychiatric:        Behavior: Behavior normal.        Thought Content: Thought content normal.        Judgment: Judgment normal.     ED Results / Procedures / Treatments   Labs (all labs ordered are listed, but only abnormal results are displayed) Labs Reviewed  BASIC METABOLIC PANEL - Abnormal; Notable for the following components:      Result Value   Potassium 3.2 (*)    Glucose, Bld 101 (*)    All other components within normal limits  CBC - Abnormal; Notable for the following components:   RBC 3.81 (*)    Hemoglobin 10.7 (*)    MCHC 29.2 (*)    All other components within normal limits  RESPIRATORY PANEL BY RT PCR (FLU A&B, COVID)  LEVETIRACETAM LEVEL  I-STAT BETA HCG BLOOD, ED ([facility], [facility], [facility] ONLY)    EKG EKG Interpretation  Date/Time:  Monday February 06 2020 16:45:04 EDT Ventricular Rate:  139 PR Interval:    QRS Duration: 80 QT Interval:  312 QTC Calculation: 475 R Axis:   31 Text Interpretation: Sinus tachycardia Minimal ST depression, lateral leads since last tracing no significant change Confirmed by Mancel Bale [phone]) on 02/06/2020 5:44:32 PM   Radiology CT Head Wo Contrast  Result Date: 02/06/2020 CLINICAL DATA:  Seizure, fall EXAM: CT HEAD WITHOUT CONTRAST TECHNIQUE: Contiguous axial images were obtained from the base of the skull through the vertex without intravenous contrast. COMPARISON:  10/02/2018 FINDINGS: Brain: There is no acute intracranial hemorrhage or mass effect. There is an area of  hypoattenuation with loss of gray-white differentiation involving right middle and inferior frontal gyri. There is no extra-axial fluid collection. Mild prominence of the ventricles and sulci for age likely reflecting parenchymal volume loss. Vascular: No hyperdense vessel or unexpected calcification. Skull: Calvarium is unremarkable. Sinuses/Orbits: No acute finding. Other: None. IMPRESSION: Small area of abnormal density in the right frontal lobe suspicious for acute or subacute infarction. MRI recommended for further evaluation. Prominence of the ventricles and sulci greater than expected for age. These results were called by telephone at the time of interpretation on 02/06/2020 at 7:01 pm to provider [facility] , who verbally acknowledged these results. Electronically Signed   By: Guadlupe Spanish M.D.   On: 02/06/2020 19:02   CT Cervical Spine Wo Contrast  Result Date: 02/06/2020 CLINICAL DATA:  Fall EXAM: CT CERVICAL SPINE WITHOUT CONTRAST TECHNIQUE: Multidetector CT imaging of the cervical spine was performed without intravenous contrast. Multiplanar CT image reconstructions were also generated. COMPARISON:  None. FINDINGS: Alignment: Anteroposterior alignment is maintained. Skull base and vertebrae: No acute fracture. There is a screw traversing the C2 vertebral body and a nonunited dens fracture. Cervical vertebral body heights are maintained. Mild, likely chronic chronic loss of height at superior endplates of upper thoracic vertebral bodies. Soft tissues and spinal canal: No prevertebral fluid or swelling. No visible canal hematoma. Disc levels:  Intervertebral disc heights are preserved. Upper chest: No apical lung mass. Other: Heterogeneous parotid glands may reflect reported underlying autoimmune disease. IMPRESSION: No acute cervical spine fracture. Electronically Signed   By: Guadlupe Spanish M.D.   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 Additionally, the pituitary gland is mildly prominent with convex border superiorly, but no discrete pituitary mass or other lesion. Electronically Signed   By: Rise Mu M.D.   On: 02/06/2020 23:14   Result Date: 02/06/2020 CLINICAL DATA:  Initial evaluation for neuro deficit, stroke suspected. EXAM: MRI HEAD WITHOUT CONTRAST MRA HEAD WITHOUT CONTRAST MRA NECK WITHOUT AND WITH CONTRAST TECHNIQUE: Multiplanar, multiecho pulse sequences of the brain and surrounding structures were obtained without intravenous contrast. Angiographic images of the Circle of Willis were obtained using MRA technique without intravenous contrast. Angiographic images of the neck were obtained using MRA technique without and with intravenous contrast. Carotid stenosis measurements (when applicable) are obtained utilizing NASCET criteria, using the distal internal carotid diameter as the denominator. CONTRAST:  83mL GADAVIST GADOBUTROL 1 MMOL/ML IV SOLN COMPARISON:  Prior CT from earlier the same day. FINDINGS: MRI HEAD FINDINGS Brain: Cerebral volume within normal limits for patient age. Focal linear area of T2/FLAIR signal abnormality seen radiating from the right frontal periventricular white matter to the overlying right frontal cortex, corresponding with abnormality seen on prior CT (series 7, images 15-18). Finding most characteristic of chronic encephalomalacia and gliosis. Small amount of chronic hemosiderin staining noted as well. Finding felt to be most consistent with a chronic ischemic infarct. Minimal T2/FLAIR hyperintensity noted within the periventricular white matter, nonspecific, but suspected to be related to underlying chronic microvascular ischemic disease, mildly advanced for age. Additionally, there is a suspected subtle punctate remote left  thalamic lacunar infarct (series 14, image 13). No abnormal foci of restricted diffusion to suggest acute or subacute ischemia. Gray-white matter differentiation otherwise well maintained. No other areas of chronic infarction. No other foci of susceptibility artifact to suggest acute or chronic intracranial hemorrhage. No mass lesion, midline shift or mass effect. No hydrocephalus. No extra-axial fluid collection. Major dural sinuses are grossly patent. Pituitary gland and suprasellar region are normal. Midline structures intact and normal. No abnormal enhancement seen within the brain itself. Subtle asymmetric smooth dural thickening and enhancement noted overlying the left cerebral hemisphere, of uncertain etiology or significance, and may be post interventional in nature. Vascular: Major intracranial vascular flow voids well maintained and normal in appearance. Skull and upper cervical spine: Craniocervical junction normal. Visualized upper cervical spine within normal limits. Bone marrow signal intensity normal. No scalp soft tissue abnormality. Sinuses/Orbits: Globes and orbital soft tissues within normal limits. Mild scattered mucosal thickening seen about the ethmoidal air cells. Paranasal sinuses are otherwise clear. No mastoid effusion. Inner ear structures grossly normal. Other: None. MRA HEAD FINDINGS ANTERIOR CIRCULATION: Examination somewhat degraded by motion artifact. Distal cervical segments of the internal carotid arteries are patent with symmetric antegrade flow. Petrous, cavernous, and supraclinoid segments patent bilaterally without significant stenosis or other abnormality. A1 segments patent bilaterally. Normal anterior communicating artery complex. Anterior cerebral arteries patent to their distal aspects without stenosis. No M1 stenosis or occlusion. Normal MCA bifurcations. Distal MCA branches well perfused and symmetric. POSTERIOR CIRCULATION: Vertebral arteries largely codominant and  patent to the vertebrobasilar junction. Both PICAs patent. Basilar patent to its distal aspect without appreciable stenosis. Superior cerebral arteries patent bilaterally. Both PCAs supplied via the basilar as well as prominent bilateral posterior communicating arteries. PCAs well perfused to their distal aspects. No intracranial aneurysm or other vascular abnormality. MRA NECK FINDINGS AORTIC ARCH: Examination mildly limited by lack of IV contrast. Visualized aortic arch normal caliber with normal branch pattern. No hemodynamically significant stenosis seen about the origin of the  great vessels. RIGHT CAROTID SYSTEM: Right common and internal carotid arteries widely patent without stenosis, occlusion, or evidence for dissection. No significant atheromatous irregularity or narrowing about the right carotid bifurcation. LEFT CAROTID SYSTEM: Left common and internal carotid arteries widely patent without stenosis, occlusion, or evidence for dissection. No significant atheromatous irregularity or narrowing about the left carotid bifurcation. VERTEBRAL ARTERIES: Both vertebral arteries arise from the subclavian arteries. No visible proximal subclavian artery stenosis. Vertebral arteries patent within the neck without stenosis, occlusion, or evidence for dissection. There is an apparent 2.5 cm lesion within the left breast (series 10, image 63), indeterminate. IMPRESSION: MRI HEAD IMPRESSION: 1. No acute intracranial abnormality. 2. Focal area of encephalomalacia and gliosis involving the cortical and subcortical right frontal lobe, corresponding with abnormality on prior CT. 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 3. Additional subtle punctate remote lacunar infarct involving the left thalamus. 4. Underlying mild chronic small vessel ischemic disease, advanced for age. 5. Subtle asymmetric dural thickening and enhancement overlying the left cerebral hemisphere, of uncertain  significance or etiology, and could be related to prior intervention. Correlation with LP and CSF thigh use could be performed for further evaluation as clinically warranted. MRA HEAD IMPRESSION: Normal intracranial MRA. No large vessel occlusion, hemodynamically significant stenosis, or other acute vascular abnormality. No aneurysm. MRA NECK IMPRESSION: 1. Normal MRA of the neck. No hemodynamically significant stenosis or other acute vascular abnormality. 2. Apparent 2.5 cm lesion within the left breast tissue, indeterminate. Correlation with physical exam and dedicated breast ultrasound recommended for further evaluation. Electronically Signed: By: Rise Mu M.D. 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 Additionally, the pituitary gland is mildly prominent with convex border superiorly, but no discrete pituitary mass or other lesion. Electronically Signed   By: Rise Mu M.D.   On: 02/06/2020 23:14   Result Date: 02/06/2020 CLINICAL DATA:  Initial evaluation for neuro deficit, stroke suspected. EXAM: MRI HEAD WITHOUT CONTRAST MRA HEAD WITHOUT CONTRAST MRA NECK WITHOUT AND WITH CONTRAST TECHNIQUE: Multiplanar, multiecho pulse sequences of the brain and surrounding structures were obtained without intravenous contrast. Angiographic images of the Circle of Willis were obtained using MRA technique without intravenous contrast. Angiographic images of the neck were obtained using MRA technique without and with intravenous contrast. Carotid stenosis measurements (when applicable) are obtained utilizing NASCET criteria, using the distal internal carotid diameter as the denominator. CONTRAST:  71mL GADAVIST GADOBUTROL 1 MMOL/ML IV SOLN COMPARISON:  Prior CT from earlier the same day.  FINDINGS: MRI HEAD FINDINGS Brain: Cerebral volume within normal limits for patient age. Focal linear area of T2/FLAIR signal abnormality seen radiating from the right frontal periventricular white matter to the overlying right frontal cortex, corresponding with abnormality seen on prior CT (series 7, images 15-18). Finding most characteristic of chronic encephalomalacia and gliosis. Small amount of chronic hemosiderin staining noted as well. Finding felt to be most consistent with a chronic ischemic infarct. Minimal T2/FLAIR hyperintensity noted within the periventricular white matter, nonspecific, but suspected to be related to underlying chronic microvascular ischemic disease, mildly advanced for age. Additionally, there is a suspected subtle punctate remote left thalamic lacunar infarct (series 14, image 13). No abnormal foci of restricted diffusion to suggest acute or subacute ischemia. Gray-white matter differentiation otherwise well maintained.  No other areas of chronic infarction. No other foci of susceptibility artifact to suggest acute or chronic intracranial hemorrhage. No mass lesion, midline shift or mass effect. No hydrocephalus. No extra-axial fluid collection. Major dural sinuses are grossly patent. Pituitary gland and suprasellar region are normal. Midline structures intact and normal. No abnormal enhancement seen within the brain itself. Subtle asymmetric smooth dural thickening and enhancement noted overlying the left cerebral hemisphere, of uncertain etiology or significance, and may be post interventional in nature. Vascular: Major intracranial vascular flow voids well maintained and normal in appearance. Skull and upper cervical spine: Craniocervical junction normal. Visualized upper cervical spine within normal limits. Bone marrow signal intensity normal. No scalp soft tissue abnormality. Sinuses/Orbits: Globes and orbital soft tissues within normal limits. Mild scattered mucosal thickening  seen about the ethmoidal air cells. Paranasal sinuses are otherwise clear. No mastoid effusion. Inner ear structures grossly normal. Other: None. MRA HEAD FINDINGS ANTERIOR CIRCULATION: Examination somewhat degraded by motion artifact. Distal cervical segments of the internal carotid arteries are patent with symmetric antegrade flow. Petrous, cavernous, and supraclinoid segments patent bilaterally without significant stenosis or other abnormality. A1 segments patent bilaterally. Normal anterior communicating artery complex. Anterior cerebral arteries patent to their distal aspects without stenosis. No M1 stenosis or occlusion. Normal MCA bifurcations. Distal MCA branches well perfused and symmetric. POSTERIOR CIRCULATION: Vertebral arteries largely codominant and patent to the vertebrobasilar junction. Both PICAs patent. Basilar patent to its distal aspect without appreciable stenosis. Superior cerebral arteries patent bilaterally. Both PCAs supplied via the basilar as well as prominent bilateral posterior communicating arteries. PCAs well perfused to their distal aspects. No intracranial aneurysm or other vascular abnormality. MRA NECK FINDINGS AORTIC ARCH: Examination mildly limited by lack of IV contrast. Visualized aortic arch normal caliber with normal branch pattern. No hemodynamically significant stenosis seen about the origin of the great vessels. RIGHT CAROTID SYSTEM: Right common and internal carotid arteries widely patent without stenosis, occlusion, or evidence for dissection. No significant atheromatous irregularity or narrowing about the right carotid bifurcation. LEFT CAROTID SYSTEM: Left common and internal carotid arteries widely patent without stenosis, occlusion, or evidence for dissection. No significant atheromatous irregularity or narrowing about the left carotid bifurcation. VERTEBRAL ARTERIES: Both vertebral arteries arise from the subclavian arteries. No visible proximal subclavian artery  stenosis. Vertebral arteries patent within the neck without stenosis, occlusion, or evidence for dissection. There is an apparent 2.5 cm lesion within the left breast (series 10, image 63), indeterminate. IMPRESSION: MRI HEAD IMPRESSION: 1. No acute intracranial abnormality. 2. Focal area of encephalomalacia and gliosis involving the cortical and subcortical right frontal lobe, corresponding with abnormality on prior CT. 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 3. Additional subtle punctate remote lacunar infarct involving the left thalamus. 4. Underlying mild chronic small vessel ischemic disease, advanced for age. 5. Subtle asymmetric dural thickening and enhancement overlying the left cerebral hemisphere, of uncertain significance or etiology, and could be related to prior intervention. Correlation with LP and CSF thigh use could be performed for further evaluation as clinically warranted. MRA HEAD IMPRESSION: Normal intracranial MRA. No large vessel occlusion, hemodynamically significant stenosis, or other acute vascular abnormality. No aneurysm. MRA NECK IMPRESSION: 1. Normal MRA of the neck. No hemodynamically significant stenosis or other acute vascular abnormality. 2. Apparent 2.5 cm lesion within the left breast tissue, indeterminate. Correlation with physical exam and dedicated breast ultrasound recommended for further evaluation. Electronically Signed: By: Rise Mu M.D. 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 Additionally, the pituitary gland is mildly prominent with convex border superiorly, but no discrete pituitary mass or other lesion. Electronically Signed   By: Rise Mu M.D.   On: 02/06/2020 23:14   Result Date:  02/06/2020 CLINICAL DATA:  Initial evaluation for neuro deficit, stroke suspected. EXAM: MRI HEAD WITHOUT CONTRAST MRA HEAD WITHOUT CONTRAST MRA NECK WITHOUT AND WITH CONTRAST TECHNIQUE: Multiplanar, multiecho pulse sequences of the brain and surrounding structures were obtained without intravenous contrast. Angiographic images of the Circle of Willis were obtained using MRA technique without intravenous contrast. Angiographic images of the neck were obtained using MRA technique without and with intravenous contrast. Carotid stenosis measurements (when applicable) are obtained utilizing NASCET criteria, using the distal internal carotid diameter as the denominator. CONTRAST:  14mL GADAVIST GADOBUTROL 1 MMOL/ML IV SOLN COMPARISON:  Prior CT from earlier the same day. FINDINGS: MRI HEAD FINDINGS Brain: Cerebral volume within normal limits for patient age. Focal linear area of T2/FLAIR signal abnormality seen radiating from the right frontal periventricular white matter to the overlying right frontal cortex, corresponding with abnormality seen on prior CT (series 7, images 15-18). Finding most characteristic of chronic encephalomalacia and gliosis. Small amount of chronic hemosiderin staining noted as well. Finding felt to be most consistent with a chronic ischemic infarct. Minimal T2/FLAIR hyperintensity noted within the periventricular white matter, nonspecific, but suspected to be related to underlying chronic microvascular ischemic disease, mildly advanced for age. Additionally, there is a suspected subtle punctate remote left thalamic lacunar infarct (series 14, image 13). No abnormal foci of restricted diffusion to suggest acute or subacute ischemia. Gray-white matter differentiation otherwise well maintained. No other areas of chronic infarction. No other foci of susceptibility artifact to suggest acute or chronic intracranial hemorrhage. No mass lesion, midline shift or mass effect. No hydrocephalus. No  extra-axial fluid collection. Major dural sinuses are grossly patent. Pituitary gland and suprasellar region are normal. Midline structures intact and normal. No abnormal enhancement seen within the brain itself. Subtle asymmetric smooth dural thickening and enhancement noted overlying the left cerebral hemisphere, of uncertain etiology or significance, and may be post interventional in nature. Vascular: Major intracranial vascular flow voids well maintained and normal in appearance. Skull and upper cervical spine: Craniocervical junction normal. Visualized upper cervical spine within normal limits. Bone marrow signal intensity normal. No scalp soft tissue abnormality. Sinuses/Orbits: Globes and orbital soft tissues within normal limits. Mild scattered mucosal thickening seen about the ethmoidal air cells. Paranasal sinuses are otherwise clear. No mastoid effusion. Inner ear structures grossly normal. Other: None. MRA HEAD FINDINGS ANTERIOR CIRCULATION: Examination somewhat degraded by motion artifact. Distal cervical segments of the internal carotid arteries are patent with symmetric antegrade flow. Petrous, cavernous, and supraclinoid segments patent bilaterally without significant stenosis or other abnormality. A1 segments patent bilaterally. Normal anterior communicating artery complex. Anterior cerebral arteries patent to their distal aspects without stenosis. No M1 stenosis or occlusion. Normal MCA bifurcations. Distal MCA branches well perfused and symmetric. POSTERIOR CIRCULATION: Vertebral arteries largely codominant and patent to the vertebrobasilar junction. Both PICAs patent. Basilar patent to its distal aspect without appreciable stenosis. Superior cerebral arteries patent bilaterally. Both PCAs supplied via the basilar as well as prominent bilateral posterior communicating arteries. PCAs well perfused to their distal aspects. No intracranial aneurysm or other  vascular abnormality. MRA NECK FINDINGS  AORTIC ARCH: Examination mildly limited by lack of IV contrast. Visualized aortic arch normal caliber with normal branch pattern. No hemodynamically significant stenosis seen about the origin of the great vessels. RIGHT CAROTID SYSTEM: Right common and internal carotid arteries widely patent without stenosis, occlusion, or evidence for dissection. No significant atheromatous irregularity or narrowing about the right carotid bifurcation. LEFT CAROTID SYSTEM: Left common and internal carotid arteries widely patent without stenosis, occlusion, or evidence for dissection. No significant atheromatous irregularity or narrowing about the left carotid bifurcation. VERTEBRAL ARTERIES: Both vertebral arteries arise from the subclavian arteries. No visible proximal subclavian artery stenosis. Vertebral arteries patent within the neck without stenosis, occlusion, or evidence for dissection. There is an apparent 2.5 cm lesion within the left breast (series 10, image 63), indeterminate. IMPRESSION: MRI HEAD IMPRESSION: 1. No acute intracranial abnormality. 2. Focal area of encephalomalacia and gliosis involving the cortical and subcortical right frontal lobe, corresponding with abnormality on prior CT. 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 3. Additional subtle punctate remote lacunar infarct involving the left thalamus. 4. Underlying mild chronic small vessel ischemic disease, advanced for age. 5. Subtle asymmetric dural thickening and enhancement overlying the left cerebral hemisphere, of uncertain significance or etiology, and could be related to prior intervention. Correlation with LP and CSF thigh use could be performed for further evaluation as clinically warranted. MRA HEAD IMPRESSION: Normal intracranial MRA. No large vessel occlusion, hemodynamically significant stenosis, or other acute vascular abnormality. No aneurysm. MRA NECK IMPRESSION: 1. Normal MRA of the neck. No  hemodynamically significant stenosis or other acute vascular abnormality. 2. Apparent 2.5 cm lesion within the left breast tissue, indeterminate. Correlation with physical exam and dedicated breast ultrasound recommended for further evaluation. Electronically Signed: By: Rise MuBenjamin  McClintock M.D. On: 02/06/2020 23:08    Procedures .Critical Care Performed by: Mancel BaleWentz, Nashaun Hillmer, MD Authorized by: Mancel BaleWentz, Jayr Lupercio, MD   Critical care provider statement:    Critical care time (minutes):  35   Critical care start time:  02/06/2020 4:45 PM   Critical care end time:  02/06/2020 11:44 PM   Critical care time was exclusive of:  Separately billable procedures and treating other patients   Critical care was necessary to treat or prevent imminent or life-threatening deterioration of the following conditions:  CNS failure or compromise   Critical care was time spent personally by me on the following activities:  Blood draw for specimens, development of treatment plan with patient or surrogate, discussions with consultants, evaluation of patient's response to treatment, examination of patient, obtaining history from patient or surrogate, ordering and performing treatments and interventions, ordering and review of laboratory studies, pulse oximetry, re-evaluation of patient's condition, review of old charts and ordering and review of radiographic studies   (including critical care time)  Medications Ordered in ED Medications  levETIRAcetam (KEPPRA) IVPB 1000 mg/100 mL premix (has no administration in time range)  sodium chloride 0.9 % bolus 500 mL (0 mLs Intravenous Stopped 02/06/20 1818)  ondansetron (ZOFRAN) injection 4 mg (4 mg Intravenous Given 02/06/20 1711)  sodium chloride 0.9 % bolus 2,000 mL (0 mLs Intravenous Stopped 02/06/20 2308)  fentaNYL (SUBLIMAZE) injection 100 mcg (100 mcg Intravenous Given 02/06/20 2311)  promethazine (PHENERGAN) injection 12.5 mg (12.5 mg Intravenous Given 02/06/20 2016)    HYDROmorphone (DILAUDID) injection 1 mg (1 mg Intravenous Given 02/06/20 2028)  LORazepam (ATIVAN) injection 1 mg (1 mg Intravenous Given 02/06/20 2027)  gadobutrol (GADAVIST) 1  MMOL/ML injection 6 mL (6 mLs Intravenous Contrast Given 02/06/20 2151)  promethazine (PHENERGAN) injection 12.5 mg (12.5 mg Intravenous Given 02/06/20 2302)    ED Course  I have reviewed the triage vital signs and the nursing notes.  Pertinent labs & imaging results that were available during my care of the patient were reviewed by me and considered in my medical decision making (see chart for details).  Clinical Course as of Feb 06 2343  Mon Feb 06, 2020  1743 Normal  I-Stat beta hCG blood, ED [EW]  1743 Normal except potassium low  Basic metabolic panel - if new onset seizures(!) [EW]  1743 Normal except hemoglobin low  CBC - if new onset seizures(!) [EW]  1755 She is now awake and alert and able to give history.  She states she has been vomiting on and off for 1 week which is not atypical for her.  She is currently taking prednisone 10 mg a day, on a prolonged taper, for her lupus.  She started the taper 4 weeks ago.  She states that last night she had 2 seizures within 30 minutes, during which she fell backwards and hit her head and injured both her head and neck.  She decided to come here today for evaluation because of the ongoing problems.  She states she did not have another seizure prior to arrival today.  She states that she is visiting, and plans to return to her home in [place], [place], in 2 days.   [EW]  1911 Patient reevaluated.  She appears comfortable and is requesting Dilaudid by name.  She has received 1 dose of fentanyl.  I explained that we would continue with the planned 3 doses of fentanyl every 30 minutes as needed, before considering a change to alternative medication.  She now states that she did have a headache prior to the seizure and fall yesterday.   [EW]  1911 Plan consult  neuro hospitalist, prior to ordering additional advanced imaging, so they can help specify which tests to order, and to request consultation.   [EW]  1929 Case discussed with on-call neuro hospitalist who recommends brain MRI, with MRA of head and neck, to evaluate for acute stroke.  If there is no acute stroke, and the findings are chronic, the patient will not need additional stroke evaluation.   [EW]    Clinical Course User Index [EW] Mancel Bale, MD   MDM Rules/Calculators/A&P                           Patient Vitals for the past 24 hrs:  BP Temp Temp src Pulse Resp SpO2  02/06/20 2230 108/70 98 F (36.7 C) Oral (!) 117 (!) 31 96 %  02/06/20 1815 117/81 -- -- (!) 129 (!) 27 98 %  02/06/20 1745 116/86 -- -- (!) 130 (!) 23 100 %  02/06/20 1700 132/88 -- -- (!) 139 17 98 %  02/06/20 1628 (!) 128/93 99.1 F (37.3 C) Oral 79 18 100 %    11:44 PM Reevaluation with update and discussion. After initial assessment and treatment, an updated evaluation reveals she is comfortable now states she is thirsty and hungry.  She is ready to go home.  I discussed the findings with her including abnormal MRI of the brain, and nonspecific left breast lesion.  She will follow up with her neurologist back in [facility], and her PCP about the breast mass.Mancel Bale  Medical Decision Making:  This patient is presenting for evaluation of seizure, nausea and vomiting, which does require a range of treatment options, and is a complaint that involves a high risk of morbidity and mortality. The differential diagnoses include new seizure focus, medication noncompliance, medication intolerance, acute illness. I decided to review old records, and in summary seizure with known seizure disorder and nausea and vomiting, possibly not tolerating antiepileptic medication..  I did not require additional historical information from anyone.  Clinical Laboratory Tests Ordered, included CBC and  Metabolic panel. Review indicates Mild hypokalemia, anemia. Radiologic Tests Ordered, included  CT head, MRI brain, MRA head, MRA neck.  I independently Visualized: Radiographic images, which show chronic right brain stroke, no acute abnormalities, incidental nonspecific less breast mass on neck MRI    Critical Interventions-clinical evaluation, laboratory testing, radiography, repeat radiography, observation and reassessment  After These Interventions, the Patient was reevaluated and was found stable for discharge.  Patient with nonspecific vomiting, controlled after treatment in the ED.  Nonspecific mild metabolic abnormalities.  Patient with CT/MRI evidence for chronic infarct.  This likely is the source of her seizure focus.  She also had an incidental left breast mass, that she was informed of.  She plans on returning to her home in 2 days, follow-up there for both the abnormal MRI, and the left breast mass.  CRITICAL CARE-yes Performed by: Mancel Bale  Nursing Notes Reviewed/ Care Coordinated Applicable Imaging Reviewed Interpretation of Laboratory Data incorporated into ED treatment  The patient appears reasonably screened and/or stabilized for discharge and I doubt any other medical condition or other [facility] requiring further screening, evaluation, or treatment in the ED at this time prior to discharge.  Plan: Home Medications-continue usual; Home Treatments-gradual advance diet and activity; return here if the recommended treatment, does not improve the symptoms; Recommended follow up-follow-up with neurology and PCP as mentioned above.     Final Clinical Impression(s) / ED Diagnoses Final diagnoses:  Seizure (HCC)  Non-intractable vomiting with nausea, unspecified vomiting type  Left breast mass    Rx / DC Orders ED Discharge Orders         Ordered    promethazine (PHENERGAN) 25 MG tablet  Every 6 hours PRN        02/06/20 2335           Mancel Bale, MD 02/06/20  2344

## 2020-02-06 NOTE — ED Notes (Addendum)
Per MD hold Keppra until Keppra serum level come back   2055 After administering Fentanyl pt states "fentanyl does not work for her it is like taking tylenol, Pt states she take dilaudid and she cannot go for MRI she is claustrophobic and  MD informed her she had a stroke and she is in pain and been sick her whole life. Pt states she will not get an MRI if she cant have anything stronger for pain, Notified MD

## 2020-02-06 NOTE — ED Notes (Signed)
Pt continues to have vomiting,  Pt st's Zofran does not help she usually gets Phenergan.  Also st's no relief from pain and st's she usually gets dilaudid  Dr. Effie Shy made aware

## 2020-02-06 NOTE — ED Notes (Signed)
Pt d/c by MD and is give d/c instructions and follow up care, Pt is out of the ED ambulatory

## 2020-02-06 NOTE — Consult Note (Signed)
Neurology Consultation Reason for Consult: Seizures Referring Physician: Dr. Gray Bernhardt  CC: seizures, missed medications due to nausea and vomittting   History is obtained from: Patient and chart review   HPI: Denise Browning is a 29 y.o. 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 Lonia Skinner 04/22/2018, her seizures started at age 58, and happened once every 2 months before she started Keppra (750 mg or 1000 mg twice daily); at that time her last seizure was 05/05/2016. Routine EEG was planned as there was none on file.  Semiology was unknown as patient was amnestic to the events but reported that bystanders would tell her she had foaming, shaking and tongue/lip biting for 3-4 minutes.   In January 2020 she was also having daily headaches, for which she would take Fiorinal as well as ibuprofen;   She had a prior MRI in 2017 that was reportedly normal with a CTA head and neck showing odontoid fracture of the left s/p a screw placement  ROS: Unable to obtain as patient left hospital prior to being seen by me  Past Medical History:  Diagnosis Date  . Clotting disorder (HCC)   . Hypertension   . Lupus (HCC)   . Osteoporosis   . Seizures (HCC)    Past Surgical History:  Procedure Laterality Date  . NECK SURGERY    . PEG TUBE PLACEMENT    . power port placement    . TOOTH EXTRACTION    Left dens fracture s/p screw placement    Current Facility-Administered Medications:  .  fentaNYL (SUBLIMAZE) injection 100 mcg, 100 mcg, Intravenous, Q30 min PRN, Mancel Bale, MD, 100 mcg at 02/06/20 1818 .  levETIRAcetam (KEPPRA) IVPB 1000 mg/100 mL premix, 1,000 mg,  Intravenous, Once, Mancel Bale, MD .  promethazine (PHENERGAN) injection 12.5 mg, 12.5 mg, Intravenous, Once, Mancel Bale, MD  Current Outpatient Medications:  .  carvedilol (COREG) 6.25 MG tablet, Take 6.25 mg by mouth 2 (two) times daily with a meal., Disp: , Rfl:  .  cetirizine (ZYRTEC) 10 MG tablet, Take 10 mg by mouth as needed for allergies., Disp: , Rfl:  .  cloNIDine (CATAPRES) 0.3 MG tablet, Take 0.3 mg by mouth 2 (two) times daily., Disp: , Rfl:  .  fondaparinux (ARIXTRA) 7.5 MG/0.6ML SOLN injection, Inject 7.5 mg into the skin daily., Disp: , Rfl:  .  hydrocortisone cream 1 %, Apply 1 application topically as needed for itching., Disp: , Rfl:  .  hydroxychloroquine (PLAQUENIL) 200 MG tablet, Take 200 mg by mouth 2 (two) times daily., Disp: , Rfl:  .  ibuprofen (ADVIL,MOTRIN) 600 MG tablet, Take 600 mg by mouth every 6 (six) hours as needed for moderate pain., Disp: , Rfl:  .  levETIRAcetam (KEPPRA) 100 MG/ML solution, Take 1,500 mg by mouth 2 (two) times daily. , Disp: , Rfl:  .  lisinopril (ZESTRIL) 10 MG tablet, Take 10 mg by mouth daily., Disp: , Rfl:  .  Multiple Vitamin (MULTIVITAMIN WITH MINERALS) TABS tablet, Take 1 tablet by mouth daily., Disp: , Rfl:  .  mycophenolate (CELLCEPT) 200 MG/ML suspension, Take 500 mg by mouth 2 (two) times daily. , Disp: , Rfl:  .  ondansetron (ZOFRAN ODT) 4 MG disintegrating tablet, 4mg  ODT q4 hours prn nausea/vomit,  Disp: 10 tablet, Rfl: 0 .  oxyCODONE-acetaminophen (PERCOCET) 10-325 MG tablet, Take 1 tablet by mouth every 6 (six) hours as needed for pain., Disp: , Rfl:  .  oxyCODONE-acetaminophen (PERCOCET/ROXICET) 5-325 MG tablet, Take 1 tablet by mouth every 6 (six) hours as needed for severe pain., Disp: , Rfl:  .  predniSONE (DELTASONE) 10 MG tablet, Take 10 mg by mouth daily with breakfast., Disp: , Rfl:  .  Probiotic Product (PROBIOTIC-10 PO), Take 1 tablet by mouth daily., Disp: , Rfl:  .  promethazine (PHENERGAN) 25 MG tablet, Take  25 mg by mouth every 12 (twelve) hours as needed for nausea or vomiting., Disp: , Rfl:  .  tiZANidine (ZANAFLEX) 4 MG tablet, Take 4 mg by mouth every 8 (eight) hours as needed for muscle spasms., Disp: , Rfl:  .  zolpidem (AMBIEN) 10 MG tablet, Take 10 mg by mouth at bedtime as needed for sleep., Disp: , Rfl:    No family history on file.  Social History:  reports that she has never smoked. She has never used smokeless tobacco. She reports current alcohol use. She reports that she does not use drugs.   Exam: Current vital signs: BP 117/81   Pulse (!) 129   Temp 99.1 F (37.3 C) (Oral)   Resp (!) 27   LMP 02/02/2020   SpO2 98%  Vital signs in last 24 hours: Temp:  [99.1 F (37.3 C)] 99.1 F (37.3 C) (10/18 1628) Pulse Rate:  [79-139] 129 (10/18 1815) Resp:  [17-27] 27 (10/18 1815) BP: (116-132)/(81-93) 117/81 (10/18 1815) SpO2:  [98 %-100 %] 98 % (10/18 1815)   Physical Exam  Patient left ED prior to me examining her   I have reviewed labs in epic and the results pertinent to this consultation are:  I have reviewed the images obtained: HCT w/ age indeterminate left frontal lobe hypodensity   (prior brain imaging reports "normal")  MRI brain w/ likely chronic infarct / healed inflammatory lesion of the left frontal lobe Dural thickening/enhancement of the left compared to the right:    Impression: Patient with Lupus likely with seizures in the setting of missed medication due to emesis.   Recommendations: - MRI brain, w/ and w/o contrast given history of immunosuppression - Continue home anti-seizure medications - Patient left prior to my full evaluation; no charge.   Brooke Dare MD-PhD [facility] [phone]

## 2020-02-06 NOTE — ED Notes (Signed)
Pt requesting pain med for headache and back pain

## 2020-02-06 NOTE — ED Notes (Signed)
Pt to CT at this time.

## 2020-02-06 NOTE — ED Notes (Signed)
Pt st's she has had nausea and vomiting x's 4 days  Not able to keep her Keppra down.

## 2020-02-06 NOTE — ED Triage Notes (Signed)
Pt reports multiple seizures since yesterday, takes liquid keppra. Pt also reports lupus flare up, c.o back pain, intermittent dizziness and nausea.

## 2020-02-06 NOTE — Discharge Instructions (Signed)
Gradually advance her diet over the next couple of days starting with clear liquids.  You have an abrasion of your tongue, which will likely be sore for a few days.  This was caused by the seizure.  Call your neurologist for follow-up appointment as soon as possible to discuss the seizure and the abnormal head CT and brain MRI.  These showed an old stroke, that may be causing your seizures.  Also the MRI of the neck, showed a left breast mass about 2.5 cm.  This should be followed up with by an ultrasound, which your [place].  Continue taking your usual medications including the Keppra.

## 2020-02-06 NOTE — ED Notes (Signed)
Pt had seizure while in triage, lasting about 2 mins, full body convulsions, drooling from right side of mouth

## 2020-02-08 LAB — LEVETIRACETAM LEVEL: Levetiracetam Lvl: 32.8 ug/mL (ref 10.0–40.0)

## 2020-03-08 NOTE — Progress Notes (Signed)
This patient was evaluated in the emergency department for seizures.  As part of the protocol, during which time she was having altered mental status, a pregnancy test was obtained, per our usual standards.  This test was ordered by nursing, as part of a nursing protocol.  It is important to know if a young woman is pregnant, when being treated for central nervous system abnormalities.  This patient could not give [place] history, by which we could rule out pregnancy.  Therefore pregnancy test was ordered.  This is usual and standard practice.  Sincerely, Mancel Bale, MD

## 2024-02-20 DEATH — deceased
